# Patient Record
Sex: Male | Born: 1950 | Race: White | Hispanic: No | Marital: Married | Smoking: Never smoker
Health system: Southern US, Community
[De-identification: ages and names within clinical notes are randomized; demographics above are authoritative.]

## PROBLEM LIST (undated history)

## (undated) DIAGNOSIS — M1990 Unspecified osteoarthritis, unspecified site: Secondary | ICD-10-CM

## (undated) DIAGNOSIS — R011 Cardiac murmur, unspecified: Secondary | ICD-10-CM

## (undated) HISTORY — PX: NASAL SEPTUM SURGERY: SHX37

## (undated) HISTORY — PX: ANKLE SURGERY: SHX546

## (undated) HISTORY — PX: COLONOSCOPY: SHX174

---

## 2002-04-01 HISTORY — PX: APPENDECTOMY: SHX54

## 2014-10-11 ENCOUNTER — Other Ambulatory Visit: Payer: Self-pay | Admitting: Family Medicine

## 2014-10-11 DIAGNOSIS — M545 Low back pain: Secondary | ICD-10-CM

## 2014-10-15 ENCOUNTER — Ambulatory Visit
Admission: RE | Admit: 2014-10-15 | Discharge: 2014-10-15 | Disposition: A | Discharge status: Home / Self Care | Payer: BLUE CROSS/BLUE SHIELD | Source: Ambulatory Visit | Attending: Family Medicine | Admitting: Family Medicine

## 2014-10-15 DIAGNOSIS — M545 Low back pain: Secondary | ICD-10-CM

## 2014-10-23 ENCOUNTER — Ambulatory Visit
Admission: RE | Admit: 2014-10-23 | Discharge: 2014-10-23 | Disposition: A | Discharge status: Home / Self Care | Payer: BLUE CROSS/BLUE SHIELD | Source: Ambulatory Visit | Attending: Family Medicine | Admitting: Family Medicine

## 2015-08-30 NOTE — H&P (Signed)
  Gregory Blackburn is an 64 y.o. male.    Chief Complaint: right shoulder pain  HPI: Pt is a 65 y.o. male complaining of right shoulder pain for multiple years. Pain had continually increased since the beginning. X-rays in the clinic show end-stage arthritic changes of the right shoulder. Pt has tried various conservative treatments which have failed to alleviate their symptoms, including injections and therapy. Various options are discussed with the patient. Risks, benefits and expectations were discussed with the patient. Patient understand the risks, benefits and expectations and wishes to proceed with surgery.   PCP:  No primary care provider on file.  D/C Plans: Home  PMH: No past medical history on file.  PSH: No past surgical history on file.  Social History:  has no tobacco, alcohol, and drug history on file.  Allergies:  Allergies not on file  Medications: No current facility-administered medications for this encounter.   No current outpatient prescriptions on file.    No results found for this or any previous visit (from the past 48 hour(s)). No results found.  ROS: Pain with rom of the right upper extremity  Physical Exam:  Alert and oriented 65 y.o. male in no acute distress Cranial nerves 2-12 intact Cervical spine: full rom with no tenderness, nv intact distally Chest: active breath sounds bilaterally, no wheeze rhonchi or rales Heart: regular rate and rhythm, no murmur Abd: non tender non distended with active bowel sounds Hip is stable with rom  Right shoulder with moderate limitation with rom nv intact distally No rashes or edema Strength of ER and IR 4/5  Assessment/Plan Assessment: right shoulder osteoarthritis  Plan: Patient will undergo a right shoulder hemi arthroplasty by Dr. Veverly Fells at [facility]. Risks benefits and expectations were discussed with the patient. Patient understand risks, benefits and expectations and wishes to proceed.

## 2015-09-01 ENCOUNTER — Encounter (HOSPITAL_COMMUNITY)
Admission: RE | Admit: 2015-09-01 | Discharge: 2015-09-01 | Disposition: A | Discharge status: Home / Self Care | Payer: BLUE CROSS/BLUE SHIELD | Source: Ambulatory Visit | Attending: Orthopedic Surgery | Admitting: Orthopedic Surgery

## 2015-09-01 ENCOUNTER — Encounter (HOSPITAL_COMMUNITY): Payer: Self-pay

## 2015-09-01 DIAGNOSIS — Z01812 Encounter for preprocedural laboratory examination: Secondary | ICD-10-CM | POA: Insufficient documentation

## 2015-09-01 DIAGNOSIS — M19011 Primary osteoarthritis, right shoulder: Secondary | ICD-10-CM | POA: Insufficient documentation

## 2015-09-01 HISTORY — DX: Unspecified osteoarthritis, unspecified site: M19.90

## 2015-09-01 HISTORY — DX: Cardiac murmur, unspecified: R01.1

## 2015-09-01 LAB — SURGICAL PCR SCREEN
MRSA, PCR: NEGATIVE
STAPHYLOCOCCUS AUREUS: POSITIVE — AB

## 2015-09-01 NOTE — Progress Notes (Signed)
I called a prescription for Mupirocin ointment to [facility], [place], [place].

## 2015-09-01 NOTE — Progress Notes (Signed)
PCP - [place] Pepper Cardiologist - denies  Chest x-ray - denies EKG - denies Stress Test - 2011 cant remember where ECHO - denies Cardiac Cath -denies     Patient denies shortness of breath and chest pain at PAT appointment

## 2015-09-01 NOTE — Pre-Procedure Instructions (Signed)
    Cadien Ensey  09/01/2015      [id number] [address], [place] [address] Phone: [phone] Fax: [phone]    Your procedure is scheduled on Friday June 9.  Report to [facility] Admitting at [place].M.  Call this number if you have problems the morning of surgery:  [phone]   Remember:  Do not eat food or drink liquids after midnight.  Take these medicines the morning of surgery with A SIP OF WATER Zicam     Do not wear lotions, powders, or perfumes.  You may NOT wear deodorant.  Men may shave face and neck.  Do not bring valuables to the hospital.  [facility] is not responsible for any belongings or valuables.  Contacts, dentures or bridgework may not be worn into surgery.  Leave your suitcase in the car.  After surgery it may be brought to your room.  For patients admitted to the hospital, discharge time will be determined by your treatment team.  Patients discharged the day of surgery will not be allowed to drive home.  Special instructions:   [facility]- Preparing For Surgery  Before surgery, you can play an important role. Because skin is not sterile, your skin needs to be as free of germs as possible. You can reduce the number of germs on your skin by washing with CHG (chlorahexidine gluconate) Soap before surgery.  CHG is an antiseptic cleaner which kills germs and bonds with the skin to continue killing germs even after washing.  Please do not use if you have an allergy to CHG or antibacterial soaps. If your skin becomes reddened/irritated stop using the CHG.  Do not shave (including legs and underarms) for at least 48 hours prior to first CHG shower. It is OK to shave your face.  Please follow these instructions carefully.   1. Shower the NIGHT BEFORE SURGERY and the MORNING OF SURGERY with CHG.   2. If you chose to wash your hair, wash your hair first as usual with your normal  shampoo.  3. After you shampoo, rinse your hair and body thoroughly to remove the shampoo.  4. Use CHG as you would any other liquid soap. You can apply CHG directly to the skin and wash gently with a scrungie or a clean washcloth.   5. Apply the CHG Soap to your body ONLY FROM THE NECK DOWN.  Do not use on open wounds or open sores. Avoid contact with your eyes, ears, mouth and genitals (private parts). Wash genitals (private parts) with your normal soap.  6. Wash thoroughly, paying special attention to the area where your surgery will be performed.  7. Thoroughly rinse your body with warm water from the neck down.  8. DO NOT shower/wash with your normal soap after using and rinsing off the CHG Soap.  9. Pat yourself dry with a CLEAN TOWEL.   10. Wear CLEAN PAJAMAS   11. Place CLEAN SHEETS on your bed the night of your first shower and DO NOT SLEEP WITH PETS.    Day of Surgery: Do not apply any deodorants/lotions. Please wear clean clothes to the hospital/surgery center.      Please read over the following fact sheets that you were given. Pain Booklet, Coughing and Deep Breathing, Blood Transfusion Information, Total Joint Packet, MRSA Information and Surgical Site Infection Prevention

## 2015-09-07 ENCOUNTER — Other Ambulatory Visit (HOSPITAL_COMMUNITY): Payer: BLUE CROSS/BLUE SHIELD

## 2015-09-08 ENCOUNTER — Ambulatory Visit (HOSPITAL_COMMUNITY): Payer: BLUE CROSS/BLUE SHIELD | Admitting: Certified Registered"

## 2015-09-08 ENCOUNTER — Encounter (HOSPITAL_COMMUNITY): Payer: Self-pay | Admitting: *Deleted

## 2015-09-08 ENCOUNTER — Observation Stay (HOSPITAL_COMMUNITY): Payer: BLUE CROSS/BLUE SHIELD

## 2015-09-08 ENCOUNTER — Encounter (HOSPITAL_COMMUNITY)
Admission: AD | Disposition: A | Discharge status: Home / Self Care | Payer: Self-pay | Source: Ambulatory Visit | Attending: Orthopedic Surgery

## 2015-09-08 ENCOUNTER — Inpatient Hospital Stay (HOSPITAL_COMMUNITY)
Admission: AD | Admit: 2015-09-08 | Discharge: 2015-09-10 | DRG: 483 | Disposition: A | Discharge status: Home / Self Care | Payer: BLUE CROSS/BLUE SHIELD | Source: Ambulatory Visit | Attending: Orthopedic Surgery | Admitting: Orthopedic Surgery

## 2015-09-08 DIAGNOSIS — M25511 Pain in right shoulder: Secondary | ICD-10-CM | POA: Diagnosis present

## 2015-09-08 DIAGNOSIS — Z96611 Presence of right artificial shoulder joint: Secondary | ICD-10-CM

## 2015-09-08 DIAGNOSIS — Z96619 Presence of unspecified artificial shoulder joint: Secondary | ICD-10-CM

## 2015-09-08 DIAGNOSIS — M19011 Primary osteoarthritis, right shoulder: Principal | ICD-10-CM | POA: Diagnosis present

## 2015-09-08 HISTORY — PX: SHOULDER HEMI-ARTHROPLASTY: SHX5049

## 2015-09-08 LAB — BASIC METABOLIC PANEL
ANION GAP: 4 — AB (ref 5–15)
BUN: 18 mg/dL (ref 6–20)
CALCIUM: 9.2 mg/dL (ref 8.9–10.3)
CO2: 29 mmol/L (ref 22–32)
Chloride: 107 mmol/L (ref 101–111)
Creatinine, Ser: 0.84 mg/dL (ref 0.61–1.24)
GLUCOSE: 99 mg/dL (ref 65–99)
Potassium: 5 mmol/L (ref 3.5–5.1)
Sodium: 140 mmol/L (ref 135–145)

## 2015-09-08 LAB — CBC
HCT: 42.5 % (ref 39.0–52.0)
HEMOGLOBIN: 14.1 g/dL (ref 13.0–17.0)
MCH: 27.8 pg (ref 26.0–34.0)
MCHC: 33.2 g/dL (ref 30.0–36.0)
MCV: 83.8 fL (ref 78.0–100.0)
PLATELETS: 167 10*3/uL (ref 150–400)
RBC: 5.07 MIL/uL (ref 4.22–5.81)
RDW: 12.4 % (ref 11.5–15.5)
WBC: 5.5 10*3/uL (ref 4.0–10.5)

## 2015-09-08 SURGERY — HEMIARTHROPLASTY, SHOULDER
Anesthesia: Regional | Site: Shoulder | Laterality: Right

## 2015-09-08 MED ORDER — LACTATED RINGERS IV SOLN
INTRAVENOUS | Status: DC | PRN
  Administered 2015-09-08: 07:00:00 via INTRAVENOUS

## 2015-09-08 MED ORDER — METOCLOPRAMIDE HCL 5 MG/ML IJ SOLN: 5.0000 mg | Freq: Three times a day (TID) | INTRAMUSCULAR | Status: DC | PRN

## 2015-09-08 MED ORDER — MIDAZOLAM HCL 2 MG/2ML IJ SOLN
INTRAMUSCULAR | Status: AC
  Filled 2015-09-08: qty 2

## 2015-09-08 MED ORDER — PROPOFOL 10 MG/ML IV BOLUS
INTRAVENOUS | Status: DC | PRN
  Administered 2015-09-08: 150 mg via INTRAVENOUS

## 2015-09-08 MED ORDER — CEFAZOLIN SODIUM-DEXTROSE 2-4 GM/100ML-% IV SOLN: 2.0000 g | INTRAVENOUS | Status: DC

## 2015-09-08 MED ORDER — HYDROMORPHONE HCL 1 MG/ML IJ SOLN
0.5000 mg | INTRAMUSCULAR | Status: DC | PRN
  Administered 2015-09-09 (×4): 0.5 mg via INTRAVENOUS
  Filled 2015-09-08 (×4): qty 1

## 2015-09-08 MED ORDER — FENTANYL CITRATE (PF) 100 MCG/2ML IJ SOLN
INTRAMUSCULAR | Status: DC | PRN
  Administered 2015-09-08 (×2): 50 ug via INTRAVENOUS

## 2015-09-08 MED ORDER — ROCURONIUM BROMIDE 100 MG/10ML IV SOLN
INTRAVENOUS | Status: DC | PRN
  Administered 2015-09-08: 50 mg via INTRAVENOUS

## 2015-09-08 MED ORDER — METHOCARBAMOL 500 MG PO TABS
500.0000 mg | ORAL_TABLET | Freq: Four times a day (QID) | ORAL | Status: DC | PRN
  Administered 2015-09-09 – 2015-09-10 (×5): 500 mg via ORAL
  Filled 2015-09-08 (×6): qty 1

## 2015-09-08 MED ORDER — SODIUM CHLORIDE 0.9 % IV SOLN
INTRAVENOUS | Status: DC
  Administered 2015-09-08 (×2): via INTRAVENOUS

## 2015-09-08 MED ORDER — ONDANSETRON HCL 4 MG PO TABS: 4.0000 mg | ORAL_TABLET | Freq: Four times a day (QID) | ORAL | Status: DC | PRN

## 2015-09-08 MED ORDER — LIDOCAINE HCL (CARDIAC) 20 MG/ML IV SOLN
INTRAVENOUS | Status: DC | PRN
  Administered 2015-09-08: 60 mg via INTRAVENOUS

## 2015-09-08 MED ORDER — MIDAZOLAM HCL 5 MG/5ML IJ SOLN
INTRAMUSCULAR | Status: DC | PRN
  Administered 2015-09-08: 2 mg via INTRAVENOUS

## 2015-09-08 MED ORDER — DOCUSATE SODIUM 100 MG PO CAPS
100.0000 mg | ORAL_CAPSULE | Freq: Two times a day (BID) | ORAL | Status: DC
  Administered 2015-09-08 – 2015-09-10 (×6): 100 mg via ORAL
  Filled 2015-09-08 (×5): qty 1

## 2015-09-08 MED ORDER — CHLORHEXIDINE GLUCONATE 4 % EX LIQD: 60.0000 mL | Freq: Once | CUTANEOUS | Status: DC

## 2015-09-08 MED ORDER — LIDOCAINE 2% (20 MG/ML) 5 ML SYRINGE
INTRAMUSCULAR | Status: AC
  Filled 2015-09-08: qty 5

## 2015-09-08 MED ORDER — METHOCARBAMOL 1000 MG/10ML IJ SOLN
500.0000 mg | Freq: Four times a day (QID) | INTRAMUSCULAR | Status: DC | PRN
  Filled 2015-09-08: qty 5

## 2015-09-08 MED ORDER — PROPOFOL 10 MG/ML IV BOLUS
INTRAVENOUS | Status: AC
  Filled 2015-09-08: qty 20

## 2015-09-08 MED ORDER — SUGAMMADEX SODIUM 200 MG/2ML IV SOLN
INTRAVENOUS | Status: DC | PRN
  Administered 2015-09-08: 140 mg via INTRAVENOUS

## 2015-09-08 MED ORDER — OXYCODONE HCL 5 MG PO TABS
5.0000 mg | ORAL_TABLET | ORAL | Status: DC | PRN
  Administered 2015-09-08 – 2015-09-10 (×12): 10 mg via ORAL
  Filled 2015-09-08 (×12): qty 2

## 2015-09-08 MED ORDER — ROCURONIUM BROMIDE 50 MG/5ML IV SOLN
INTRAVENOUS | Status: AC
  Filled 2015-09-08: qty 1

## 2015-09-08 MED ORDER — METHOCARBAMOL 500 MG PO TABS: 500.0000 mg | ORAL_TABLET | Freq: Three times a day (TID) | ORAL | Status: DC | PRN

## 2015-09-08 MED ORDER — ONDANSETRON HCL 4 MG/2ML IJ SOLN
INTRAMUSCULAR | Status: DC | PRN
  Administered 2015-09-08: 4 mg via INTRAVENOUS

## 2015-09-08 MED ORDER — METOCLOPRAMIDE HCL 5 MG PO TABS: 5.0000 mg | ORAL_TABLET | Freq: Three times a day (TID) | ORAL | Status: DC | PRN

## 2015-09-08 MED ORDER — ONDANSETRON HCL 4 MG/2ML IJ SOLN: 4.0000 mg | Freq: Four times a day (QID) | INTRAMUSCULAR | Status: DC | PRN

## 2015-09-08 MED ORDER — PHENOL 1.4 % MT LIQD: 1.0000 | OROMUCOSAL | Status: DC | PRN

## 2015-09-08 MED ORDER — FENTANYL CITRATE (PF) 250 MCG/5ML IJ SOLN
INTRAMUSCULAR | Status: AC
  Filled 2015-09-08: qty 5

## 2015-09-08 MED ORDER — POLYETHYLENE GLYCOL 3350 17 G PO PACK: 17.0000 g | PACK | Freq: Every day | ORAL | Status: DC | PRN

## 2015-09-08 MED ORDER — BUPIVACAINE-EPINEPHRINE (PF) 0.5% -1:200000 IJ SOLN
INTRAMUSCULAR | Status: DC | PRN
  Administered 2015-09-08: 30 mL via PERINEURAL

## 2015-09-08 MED ORDER — PHENYLEPHRINE HCL 10 MG/ML IJ SOLN
10.0000 mg | INTRAVENOUS | Status: DC | PRN
  Administered 2015-09-08: 50 ug/min via INTRAVENOUS

## 2015-09-08 MED ORDER — BUPIVACAINE-EPINEPHRINE (PF) 0.25% -1:200000 IJ SOLN
INTRAMUSCULAR | Status: DC | PRN
  Administered 2015-09-08: 7 mL

## 2015-09-08 MED ORDER — METOCLOPRAMIDE HCL 5 MG/ML IJ SOLN
10.0000 mg | Freq: Once | INTRAMUSCULAR | Status: DC | PRN
Start: 2015-09-08 — End: 2015-09-08

## 2015-09-08 MED ORDER — FENTANYL CITRATE (PF) 100 MCG/2ML IJ SOLN: 25.0000 ug | INTRAMUSCULAR | Status: DC | PRN

## 2015-09-08 MED ORDER — OXYCODONE-ACETAMINOPHEN 5-325 MG PO TABS: 1.0000 | ORAL_TABLET | ORAL | Status: DC | PRN

## 2015-09-08 MED ORDER — SODIUM CHLORIDE 0.9 % IR SOLN
Status: DC | PRN
  Administered 2015-09-08: 1000 mL

## 2015-09-08 MED ORDER — CEFAZOLIN SODIUM-DEXTROSE 2-4 GM/100ML-% IV SOLN
2.0000 g | Freq: Four times a day (QID) | INTRAVENOUS | Status: AC
  Administered 2015-09-08 – 2015-09-09 (×3): 2 g via INTRAVENOUS
  Filled 2015-09-08 (×4): qty 100

## 2015-09-08 MED ORDER — MENTHOL 3 MG MT LOZG: 1.0000 | LOZENGE | OROMUCOSAL | Status: DC | PRN

## 2015-09-08 MED ORDER — MEPERIDINE HCL 25 MG/ML IJ SOLN: 6.2500 mg | INTRAMUSCULAR | Status: DC | PRN

## 2015-09-08 MED ORDER — ACETAMINOPHEN 650 MG RE SUPP: 650.0000 mg | Freq: Four times a day (QID) | RECTAL | Status: DC | PRN

## 2015-09-08 MED ORDER — ACETAMINOPHEN 325 MG PO TABS
650.0000 mg | ORAL_TABLET | Freq: Four times a day (QID) | ORAL | Status: DC | PRN
  Administered 2015-09-09 – 2015-09-10 (×3): 650 mg via ORAL
  Filled 2015-09-08 (×3): qty 2

## 2015-09-08 MED ORDER — CEFAZOLIN SODIUM-DEXTROSE 2-4 GM/100ML-% IV SOLN
INTRAVENOUS | Status: AC
  Administered 2015-09-08: 2 g via INTRAVENOUS
  Filled 2015-09-08: qty 100

## 2015-09-08 SURGICAL SUPPLY — 59 items
BLADE SAW SAG 73X25 THK (BLADE) ×2
BLADE SAW SGTL 73X25 THK (BLADE) ×1 IMPLANT
BLADE SURG 10 STRL SS (BLADE) ×3 IMPLANT
BOWL SMART MIX CTS (DISPOSABLE) IMPLANT
CAPT SHLDR PARTIAL 1 ×3 IMPLANT
CLOSURE WOUND 1/2 X4 (GAUZE/BANDAGES/DRESSINGS) ×1
COVER SURGICAL LIGHT HANDLE (MISCELLANEOUS) ×3 IMPLANT
DRAPE IMP U-DRAPE 54X76 (DRAPES) ×3 IMPLANT
DRAPE INCISE IOBAN 66X45 STRL (DRAPES) ×3 IMPLANT
DRAPE ORTHO SPLIT 87X125 STRL (DRAPES) ×6 IMPLANT
DRAPE U-SHAPE 47X51 STRL (DRAPES) ×3 IMPLANT
DRILL BIT 5/64 (BIT) ×3 IMPLANT
DRSG ADAPTIC 3X8 NADH LF (GAUZE/BANDAGES/DRESSINGS) ×3 IMPLANT
DRSG PAD ABDOMINAL 8X10 ST (GAUZE/BANDAGES/DRESSINGS) ×3 IMPLANT
DURAPREP 26ML APPLICATOR (WOUND CARE) ×3 IMPLANT
ELECT NEEDLE TIP 2.8 STRL (NEEDLE) ×3 IMPLANT
ELECT REM PT RETURN 9FT ADLT (ELECTROSURGICAL) ×3
ELECTRODE REM PT RTRN 9FT ADLT (ELECTROSURGICAL) ×1 IMPLANT
GAUZE SPONGE 4X4 12PLY STRL (GAUZE/BANDAGES/DRESSINGS) ×3 IMPLANT
GLOVE BIOGEL PI ORTHO PRO 7.5 (GLOVE) ×2
GLOVE BIOGEL PI ORTHO PRO SZ8 (GLOVE) ×2
GLOVE ORTHO TXT STRL SZ7.5 (GLOVE) ×3 IMPLANT
GLOVE PI ORTHO PRO STRL 7.5 (GLOVE) ×1 IMPLANT
GLOVE PI ORTHO PRO STRL SZ8 (GLOVE) ×1 IMPLANT
GLOVE SURG ORTHO 8.5 STRL (GLOVE) ×3 IMPLANT
GOWN STRL REUS W/ TWL XL LVL3 (GOWN DISPOSABLE) ×2 IMPLANT
GOWN STRL REUS W/TWL XL LVL3 (GOWN DISPOSABLE) ×4
KIT BASIN OR (CUSTOM PROCEDURE TRAY) ×3 IMPLANT
KIT ROOM TURNOVER OR (KITS) ×3 IMPLANT
MANIFOLD NEPTUNE II (INSTRUMENTS) ×3 IMPLANT
NDL SUT .5 MAYO 1.404X.05X (NEEDLE) ×1 IMPLANT
NEEDLE HYPO 25GX1X1/2 BEV (NEEDLE) ×3 IMPLANT
NEEDLE MAYO TAPER (NEEDLE) ×2
NS IRRIG 1000ML POUR BTL (IV SOLUTION) ×3 IMPLANT
PACK SHOULDER (CUSTOM PROCEDURE TRAY) ×3 IMPLANT
PACK UNIVERSAL I (CUSTOM PROCEDURE TRAY) ×3 IMPLANT
PAD ARMBOARD 7.5X6 YLW CONV (MISCELLANEOUS) ×6 IMPLANT
SLEEVE SURGEON STRL (DRAPES) ×3 IMPLANT
SLING ARM FOAM STRAP LRG (SOFTGOODS) ×3 IMPLANT
SLING ARM IMMOBILIZER LRG (SOFTGOODS) ×3 IMPLANT
SLING ARM IMMOBILIZER MED (SOFTGOODS) IMPLANT
SPONGE LAP 18X18 X RAY DECT (DISPOSABLE) ×3 IMPLANT
STRIP CLOSURE SKIN 1/2X4 (GAUZE/BANDAGES/DRESSINGS) ×2 IMPLANT
SUCTION FRAZIER HANDLE 10FR (MISCELLANEOUS) ×2
SUCTION TUBE FRAZIER 10FR DISP (MISCELLANEOUS) ×1 IMPLANT
SUT FIBERWIRE #2 38 T-5 BLUE (SUTURE) ×15
SUT MNCRL AB 4-0 PS2 18 (SUTURE) ×3 IMPLANT
SUT VIC AB 0 CT1 27 (SUTURE) ×2
SUT VIC AB 0 CT1 27XBRD ANBCTR (SUTURE) ×1 IMPLANT
SUT VIC AB 0 CT2 27 (SUTURE) ×3 IMPLANT
SUT VIC AB 2-0 CT1 27 (SUTURE) ×2
SUT VIC AB 2-0 CT1 TAPERPNT 27 (SUTURE) ×1 IMPLANT
SUTURE FIBERWR #2 38 T-5 BLUE (SUTURE) ×5 IMPLANT
SYR BULB IRRIGATION 50ML (SYRINGE) IMPLANT
SYR CONTROL 10ML LL (SYRINGE) ×3 IMPLANT
TOWEL OR 17X24 6PK STRL BLUE (TOWEL DISPOSABLE) ×3 IMPLANT
TOWEL OR 17X26 10 PK STRL BLUE (TOWEL DISPOSABLE) ×3 IMPLANT
TRAY FOLEY CATH 16FRSI W/METER (SET/KITS/TRAYS/PACK) IMPLANT
WATER STERILE IRR 1000ML POUR (IV SOLUTION) ×3 IMPLANT

## 2015-09-08 NOTE — Anesthesia Postprocedure Evaluation (Signed)
Anesthesia Post Note  Patient: Gregory Blackburn  Procedure(s) Performed: Procedure(s) (LRB): RIGHT SHOULDER HEMI-ARTHROPLASTY  (Right)  Patient location during evaluation: PACU Anesthesia Type: General and Regional Level of consciousness: awake and alert and oriented Pain management: pain level controlled Vital Signs Assessment: post-procedure vital signs reviewed and stable Respiratory status: spontaneous breathing, nonlabored ventilation and respiratory function stable Cardiovascular status: blood pressure returned to baseline and stable Postop Assessment: no signs of nausea or vomiting Anesthetic complications: no Comments: No complaints of pain.    Last Vitals:  Filed Vitals:   09/08/15 0942 09/08/15 0945  BP: 128/74   Pulse: 65 61  Temp:    Resp: 15 13    Last Pain:  Filed Vitals:   09/08/15 0948  PainSc: 0-No pain                 Beadie Matsunaga A.

## 2015-09-08 NOTE — Brief Op Note (Signed)
09/08/2015  9:23 AM  PATIENT:  Gregory Blackburn  65 y.o. male  PRE-OPERATIVE DIAGNOSIS:  RIGHT SHOULDER ROTATOR CUFF ARTHOPATHY   POST-OPERATIVE DIAGNOSIS:  RIGHT SHOULDER ROTATOR CUFF ARTHOPATHY   PROCEDURE:  Procedure(s): RIGHT SHOULDER HEMI-ARTHROPLASTY  (Right) [employer] unite with CTA Head  SURGEON:  Surgeon(s) and Role:    * Netta Cedars, MD - Primary  PHYSICIAN ASSISTANT:   ASSISTANTS: Gerrit Halls, PA-C   ANESTHESIA:   regional and general  EBL:     BLOOD ADMINISTERED:none  DRAINS: none   LOCAL MEDICATIONS USED:  MARCAINE     SPECIMEN:  No Specimen  DISPOSITION OF SPECIMEN:  N/A  COUNTS:  YES  TOURNIQUET:  * No tourniquets in log *  DICTATION: .Other Dictation: Dictation Number [id number]  PLAN OF CARE: Admit to inpatient   PATIENT DISPOSITION:  PACU - hemodynamically stable.   Delay start of Pharmacological VTE agent (>24hrs) due to surgical blood loss or risk of bleeding: yes

## 2015-09-08 NOTE — Anesthesia Procedure Notes (Addendum)
Date/Time: 09/08/2015 7:05 AM Performed by: Josephine Igo Oxygen Delivery Method: Nasal cannula    Anesthesia Regional Block:  Interscalene brachial plexus block  Pre-Anesthetic Checklist: ,, timeout performed, Correct Patient, Correct Site, Correct Laterality, Correct Procedure, Correct Position, site marked, Risks and benefits discussed,  Surgical consent,  Pre-op evaluation,  At surgeon's request and post-op pain management  Laterality: Right  Prep: chloraprep       Needles:  Injection technique: Single-shot  Needle Type: Echogenic Stimulator Needle     Needle Length: 5cm 5 cm Needle Gauge: 21 and 21 G  Needle insertion depth: 3 cm   Additional Needles:  Procedures: ultrasound guided (picture in chart) Interscalene brachial plexus block Narrative:  Start time: 09/08/2015 7:15 AM End time: 09/08/2015 7:27 AM  Performed by: Personally  Anesthesiologist: Josephine Igo  Additional Notes: Relevant anatomy ID'd by [place]. Incremental 29ml injection with frequent aspiration. Patient tolerated procedrue well.   Procedure Name: Intubation Date/Time: 09/08/2015 7:40 AM Performed by: Oletta Lamas Pre-anesthesia Checklist: Patient identified, Emergency Drugs available, Suction available and Patient being monitored Patient Re-evaluated:Patient Re-evaluated prior to inductionOxygen Delivery Method: Circle System Utilized Preoxygenation: Pre-oxygenation with 100% oxygen Intubation Type: IV induction Ventilation: Mask ventilation without difficulty Laryngoscope Size: Mac and 3 Grade View: Grade I Tube type: Oral Tube size: 8.0 mm Number of attempts: 2 Airway Equipment and Method: Stylet Placement Confirmation: ETT inserted through vocal cords under direct vision,  positive ETCO2 and breath sounds checked- equal and bilateral Secured at: 23 cm Tube secured with: Tape Dental Injury: Teeth and Oropharynx as per pre-operative assessment       Right Interscalene Block

## 2015-09-08 NOTE — Transfer of Care (Signed)
Immediate Anesthesia Transfer of Care Note  Patient: Gregory Blackburn  Procedure(s) Performed: Procedure(s): RIGHT SHOULDER HEMI-ARTHROPLASTY  (Right)  Patient Location: PACU  Anesthesia Type:GA combined with regional for post-op pain  Level of Consciousness: awake, alert  and patient cooperative  Airway & Oxygen Therapy: Patient Spontanous Breathing  Post-op Assessment: Report given to RN  Post vital signs: stable  Last Vitals:  Filed Vitals:   09/08/15 0600 09/08/15 0925  BP: 119/76   Pulse: 54   Temp: 36.6 C 36.4 C  Resp: 20     Last Pain: There were no vitals filed for this visit.    Patients Stated Pain Goal: 5 ([id number])  Complications: No apparent anesthesia complications   To PACU 2L [place].  No pain or discomforts voiced.

## 2015-09-08 NOTE — Anesthesia Preprocedure Evaluation (Addendum)
Anesthesia Evaluation  Patient identified by MRN, date of birth, ID band Patient awake    Reviewed: Allergy & Precautions, NPO status , Patient's Chart, lab work & pertinent test results  Airway Mallampati: II  TM Distance: >3 FB Neck ROM: Full    Dental no notable dental hx. (+) Teeth Intact, Dental Advisory Given   Pulmonary neg pulmonary ROS,    Pulmonary exam normal breath sounds clear to auscultation       Cardiovascular negative cardio ROS Normal cardiovascular exam+ Valvular Problems/Murmurs  Rhythm:Regular Rate:Normal     Neuro/Psych negative neurological ROS  negative psych ROS   GI/Hepatic negative GI ROS, Neg liver ROS,   Endo/Other  negative endocrine ROS  Renal/GU negative Renal ROS  negative genitourinary   Musculoskeletal  (+) Arthritis , Osteoarthritis,  Right shoulder rotator cuff arthropathy   Abdominal   Peds  Hematology negative hematology ROS (+)   Anesthesia Other Findings   Reproductive/Obstetrics                            Anesthesia Physical Anesthesia Plan  ASA: I  Anesthesia Plan: General and Regional   Post-op Pain Management: GA combined w/ Regional for post-op pain   Induction: Intravenous  Airway Management Planned: Oral ETT  Additional Equipment:   Intra-op Plan:   Post-operative Plan: Extubation in OR  Informed Consent: I have reviewed the patients History and Physical, chart, labs and discussed the procedure including the risks, benefits and alternatives for the proposed anesthesia with the patient or authorized representative who has indicated his/her understanding and acceptance.   Dental advisory given  Plan Discussed with: CRNA, Anesthesiologist and Surgeon  Anesthesia Plan Comments:        Anesthesia Quick Evaluation

## 2015-09-08 NOTE — Interval H&P Note (Signed)
History and Physical Interval Note:  09/08/2015 7:28 AM  Gregory Blackburn  has presented today for surgery, with the diagnosis of RIGHT SHOULDER ROTATOR CUFF ARTHOPATHY   The various methods of treatment have been discussed with the patient and family. After consideration of risks, benefits and other options for treatment, the patient has consented to  Procedure(s): RIGHT SHOULDER HEMI-ARTHROPLASTY  (Right) as a surgical intervention .  The patient's history has been reviewed, patient examined, no change in status, stable for surgery.  I have reviewed the patient's chart and labs.  Questions were answered to the patient's satisfaction.     Bhargav Barbaro,STEVEN R

## 2015-09-09 NOTE — Progress Notes (Signed)
Subjective: 1 Day Post-Op Procedure(s) (LRB): RIGHT SHOULDER HEMI-ARTHROPLASTY  (Right) Patient reports pain as moderate requiring IV medicine. Tolerating PO's well. Reports a good night.  Wife at bedside. Denies SOB,CP,or F/c.  Objective: Vital signs in last 24 hours: Temp:  [97 F (36.1 C)-98.6 F (37 C)] 98.6 F (37 C) (06/10 0441) Pulse Rate:  [47-73] 73 (06/10 0441) Resp:  [12-16] 16 (06/10 0441) BP: (93-139)/(55-81) 130/71 mmHg (06/10 0441) SpO2:  [95 %-100 %] 95 % (06/10 0441)  Intake/Output from previous day: 06/09 0701 - 06/10 0700 In: 2249.2 [P.O.:840; I.V.:1309.2; IV Piggyback:100] Out: 75 [Blood:75] Intake/Output this shift: Total I/O In: 1107.5 [P.O.:240; I.V.:867.5] Out: -    Recent Labs  09/08/15 0643  HGB 14.1    Recent Labs  09/08/15 0643  WBC 5.5  RBC 5.07  HCT 42.5  PLT 167    Recent Labs  09/08/15 0643  NA 140  K 5.0  CL 107  CO2 29  BUN 18  CREATININE 0.84  GLUCOSE 99  CALCIUM 9.2   No results for input(s): LABPT, INR in the last 72 hours.  Well nourished. Alert and oriented x3. RRR, Lungs clear, BS x4. Abdomen soft and non tender. Right Calf soft and non tender. Right shoulder dressing C/D/I. No DVT signs. Compartment soft. No signs of infection.  Right LE grossly neurovascular intact.  Assessment/Plan: 1 Day Post-Op Procedure(s) (LRB): RIGHT SHOULDER HEMI-ARTHROPLASTY  (Right) Sling for comfort Transition to PO meds Plan for D/c tomorrow OT today Continue current care   Shahram Alexopoulos L 09/09/2015, 9:06 AM

## 2015-09-09 NOTE — Op Note (Signed)
Gregory Blackburn, Gregory Blackburn NO.:  [id number]  MEDICAL RECORD NO.:  [id number]  LOCATION:  [id number]                        FACILITY:  [facility]  PHYSICIAN:  Doran Heater. Veverly Fells, M.D. DATE OF BIRTH:  1950-05-24  DATE OF PROCEDURE:  09/08/2015 DATE OF DISCHARGE:                              OPERATIVE REPORT   PREOPERATIVE DIAGNOSIS:  Right shoulder rotator cuff tear arthropathy.  POSTOPERATIVE DIAGNOSIS:  Right shoulder rotator cuff tear arthropathy.  PROCEDURE PERFORMED:  Right shoulder hemiarthroplasty using [place] with CTA head.  SURGEON:  Doran Heater. Veverly Fells, M.D.  ASSISTANT:  Judith Part. Chabon, P.A.  ANESTHESIA:  General anesthesia was used plus interscalene block.  ESTIMATED BLOOD LOSS:  Less than 100 mL.  FLUID REPLACEMENT:  1200 mL crystalloid.  INSTRUMENT COUNTS:  Correct.  COMPLICATIONS:  There were no complications.  ANTIBIOTICS:  Perioperative antibiotics given.  INDICATIONS:  The patient is a 65 year old male with worsening right shoulder pain secondary to rotator cuff tear arthropathy.  The patient has had progressive pain despite conservative management.  He has MRI evidence of complete loss of supraspinatus, infraspinatus, and teres minor muscles and tendons.  The patient has intact subscapularis.  We discussed options of management including reverse shoulder arthroplasty versus a conventional hemiarthroplasty with a CTA head to accommodate for the rotator cuff absence.  Based on his absence of teres minor and the concern for significant Hornblower's issues when he abducts his arm with a reverse shoulder and also the potential longevity for him at age 70, we did recommend a hemiarthroplasty versus a reverse.  The patient understands he will have good but not great pain relief and good function.  This will be a more durable option and allow him to do more physical things.  Risks and benefits of surgery discussed, informed consent  obtained.  DESCRIPTION OF PROCEDURE:  After an adequate level of anesthesia was achieved, the patient was positioned in the modified beach chair position.  Right shoulder was correctly identified and sterilely prepped and draped in the usual manner.  Time-out was called.  We entered the shoulder in standard deltopectoral approach.  We started at the coracoid process extending down to the anterior humerus.  We identified the cephalic vein, took it laterally with the deltoid, pectoralis taken medially.  Conjoint tendon identified and retracted medially.  We identified the subscapularis and released it subperiosteally off the lesser tuberosity and tagged for repair.  We then progressively released the inferior capsule, externally rotating.  We then went ahead and deliver the humeral head out of the wound.  We then made our head cut, using the neck resection guide and the oscillating saw.  We cut this in 20 degrees of retroversion to accommodate for the [employer] stem and the CTA head hemiarthroplasty.  We then reamed up to a size 10, this patient had a significantly tapered canal, but a pretty copious proximal metaphysis which created the tendency for the stem towards falling into varus.  We went ahead and reamed and then we broached for first the 8 and then the 10 stem.  We then went ahead and resected the bone for the CTA head hemiarthroplasty.  We  also tenodesed the biceps mid tension prior to doing the tenotomy.  We did figure-of-eight sutures through the pectoralis tendon insertion further reinforcing our tenodesis.  We then resected the remaining biceps tendon and inspected the socket, there was nice cartilage in there and the labrum was intact.  We freed up the subscapularis both on the joint side and on the extra-articular side. We had nice balance with that and felt like the subscap was adequate. We then went ahead and after cutting the lateral greater tuberosity off for the  CTA, we trialed with a 48 x 23 trial and felt like that gave excellent coverage and also good stability.  We removed the trial components, irrigated the canal, placed #2 FiberWire suture in the lesser tuberosity through drill holes for repair.  We then used impaction grafting technique with a lot of bone graft to the medial side of the stem to keep the stem out of varus and we used some hard bone from the subchondral bone from the head and impacted that in medially as well to prevent any drift there and we had very good impaction grafting and we were able to maintain a nice vertical stem orientation, the 48 x 23 CTA head fit perfectly, we impacted that in place, reduced the shoulder, irrigated thoroughly and repaired the subscap anatomically back to the lesser tuberosity to bleeding bone and then also reinforcing into that biceps tendon there.  We were pleased with the excursion and stability following the subscap repair.  We irrigated thoroughly and then closed the deltopectoral interval with 0 Vicryl suture followed by 2-0 Vicryl for subcutaneous closure and 4-0 Monocryl for skin.  Steri- Strips applied followed by sterile dressing.  The patient tolerated the surgery well.     Doran Heater. Veverly Fells, M.D.     SRN/MEDQ  D:  09/08/2015  T:  09/09/2015  Job:  [id number]

## 2015-09-09 NOTE — Progress Notes (Signed)
Orthopedic Tech Progress Note Patient Details:  Gregory Blackburn 1950-10-22 [id number]  Ortho Devices Type of Ortho Device: Arm sling Ortho Device/Splint Location: rue Ortho Device/Splint Interventions: Application   Hildred Priest 09/09/2015, 2:51 PM

## 2015-09-09 NOTE — Evaluation (Signed)
Occupational Therapy Evaluation Patient Details Name: Gregory Blackburn MRN: [id number] DOB: 1951-02-19 Today's Date: 09/09/2015    History of Present Illness 65 y.o. 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 Pt now s/p Rt shoulder hemiarthroplasty.   Clinical Impression   Pt s/p above. Pt getting assist with UB dressing at times, PTA. Feel pt will benefit from acute OT to increase independence and address RUE prior to d/c.     Follow Up Recommendations  No OT follow up;Supervision - Intermittent    Equipment Recommendations  Other (comment) (TBD-may benefit from shower chair)    Recommendations for Other Services       Precautions / Restrictions Precautions Precautions: Shoulder Type of Shoulder Precautions: conservative protocol: no movement of Rt shoulder, AROM elbow, wrist hand okay; no pushing, pulling, lifting with RUE (can use to hold light items). Shoulder Interventions: Shoulder sling/immobilizer;At all times;Off for dressing/bathing/exercises Precaution Booklet Issued: Yes (comment) Precaution Comments: educated on shoulder precautions Required Braces or Orthoses: Sling Restrictions Weight Bearing Restrictions: Yes RUE Weight Bearing: Non weight bearing      Mobility Bed Mobility Overal bed mobility: Needs Assistance Bed Mobility: Supine to Sit;Sit to Supine     Supine to sit: Supervision Sit to supine:  (went to supine at Mod I but cue to scoot HOB)      Transfers Overall transfer level: Modified independent                    Balance    No LOB in session.                                        ADL Overall ADL's : Needs assistance/impaired                     Lower Body Dressing: Minimal assistance;Sit to/from stand   Toilet Transfer: Supervision/safety;Ambulation (sit to stand from bed)           Functional mobility during ADLs: Supervision/safety       Vision     Perception     Praxis      Pertinent  Vitals/Pain Pain Assessment: 0-10 Pain Score:  (7.5) Pain Location: RUE Pain Descriptors / Indicators: Aching Pain Intervention(s): Monitored during session;Repositioned     Hand Dominance Right   Extremity/Trunk Assessment Upper Extremity Assessment Upper Extremity Assessment: RUE deficits/detail RUE Deficits / Details: elbow, wrist, hand ROM WFL RUE Sensation: decreased light touch RUE Coordination: decreased fine motor (reports it's slower than normal )   Lower Extremity Assessment Lower Extremity Assessment: Overall WFL for tasks assessed       Communication Communication Communication: No difficulties   Cognition Arousal/Alertness: Lethargic Behavior During Therapy: WFL for tasks assessed/performed Overall Cognitive Status: Within Functional Limits for tasks assessed                     General Comments       Exercises Exercises: Other exercises;Shoulder Other Exercises Other Exercises: performed Rt elbow flexion/extension, moved right digits (flexion/extension), and Rt wrist flexion/extension. pt also moved neck around   Shoulder Instructions Shoulder Instructions Donning/doffing shirt without moving shoulder:  (educated on technique and UB clothing ) Method for sponge bathing under operated UE:  (educated) Donning/doffing sling/immobilizer: Maximal assistance (educated and assisted) Correct positioning of sling/immobilizer: Maximal assistance (educated and assisted) ROM for elbow, wrist and digits of operated UE: Supervision/safety  Sling wearing schedule (on at all times/off for ADL's):  (educated) Proper positioning of operated UE when showering:  (educated) Positioning of UE while sleeping:  (educated)    Home Living Family/patient expects to be discharged to:: Private residence Living Arrangements: Spouse/significant other Available Help at Discharge: Family;Available 24 hours/day Type of Home: Apartment Home Access: Stairs to enter Entrance  Stairs-Number of Steps: 16 Entrance Stairs-Rails: Right;Left;Can reach both Home Layout: One level     Bathroom Shower/Tub: Occupational psychologist: Standard                Prior Functioning/Environment Level of Independence: Needs assistance    ADL's / Homemaking Assistance Needed: assist with UB dressing at times        OT Diagnosis: Acute pain   OT Problem List: Decreased knowledge of use of DME or AE;Decreased knowledge of precautions;Decreased coordination;Impaired UE functional use;Pain;Impaired sensation;Decreased activity tolerance;Decreased range of motion   OT Treatment/Interventions: Self-care/ADL training;DME and/or AE instruction;Therapeutic activities;Balance training;Patient/family education;Therapeutic exercise    OT Goals(Current goals can be found in the care plan section) Acute Rehab OT Goals Patient Stated Goal: not stated OT Goal Formulation: With patient Time For Goal Achievement: 09/16/15 Potential to Achieve Goals: Good ADL Goals Additional ADL Goal #1: pt/caregiver will be at Mod I level for managing ADLS while maintaining shoulder precautions. Additional ADL Goal #2: Pt will be independent with RUE HEP while maintaining shoulder precautions.  OT Frequency: Min 2X/week   Barriers to D/C:            Co-evaluation              End of Session Equipment Utilized During Treatment: Other (comment) (sling)  Activity Tolerance: Patient tolerated treatment well Patient left: in bed;with call bell/phone within reach   Time: [id number] OT Time Calculation (min): 21 min Charges:  OT General Charges $OT Visit: 1 Procedure OT Evaluation $OT Eval Moderate Complexity: 1 Procedure G-CodesBenito Mccreedy OTR/L [id number] 09/09/2015, 10:12 AM

## 2015-09-10 NOTE — Discharge Instructions (Signed)
Ice to the shoulder as much as possible.  Do not push pull or lift with the right arm  Use the sling while up to support the arm, may remove and rest the arm on a pillow while seated.  Use sling at night.  Keep the incision clean and dry and covered for one week, then ok to get it wet in the shower.  Do exercises every hour while awake - pendulums, lap slides and wrist and elbow ROM  Follow up in the office in two weeks  [phone] Shoulder Joint Replacement, Care After Refer to this sheet in the next few weeks. These instructions provide you with information on caring for yourself after your procedure. Your health care provider may also give you more specific instructions. Your treatment has been planned according to current medical practices, but problems sometimes occur. Call your health care provider if you have any problems or questions after your procedure. WHAT TO EXPECT AFTER THE PROCEDURE After your procedure, your arm and shoulder will typically be stiff and bruised. This will improve over time. HOME CARE INSTRUCTIONS   You may resume your normal diet and activities as directed by your surgeon.  You should regain full use of your shoulder in 6 weeks.  Your arm will be in a sling. You will need to wear this for 4-6 weeks after surgery.  Wear the sling every night for at least the first month, or as instructed by your surgeon.  Do not use your arm to push yourself up in bed or from a chair. This requires too much muscle.  Follow the program of home exercises suggested. Do the exercises 4-5 times a day for a month or as directed.  Try not to overuse your shoulder. Overusing the shoulder is easy to do if this is the first time you have been pain free in a long time. Early overuse of the shoulder may result in later problems.  Do not lift anything heavier than a cup of coffee for the first 6 weeks after surgery.  Ask for help. Your health care provider may be able to suggest a  clinic or agency for this if you do not have home support.  Do not participate in contact sports or do any heavy lifting (more than 10 lb [4.5 kg]) for at least 6 months, or as directed.  Apply ice to the injured area for the first 2 days after surgery:  Put ice in a plastic bag.  Place a towel between your skin and the bag.  Leave the ice on for 20 minutes, 2-3 times a day.  Change dressings if necessary or as directed.  Only take over-the-counter or prescription medicines for pain, discomfort, or fever as directed by your health care provider.  Keep all follow-up appointments as directed. SEEK MEDICAL CARE IF:  You have redness, swelling, or increasing pain in the wound.  You see pus coming from the wound.  You have a fever.  You notice a bad smell coming from the wound or dressing.  The edges of the wound break open after sutures or staples have been removed.  You have increasing pain with movement of the shoulder. SEEK IMMEDIATE MEDICAL CARE IF:   You develop a rash.  You have chest pain or shortness of breath.  You have any reaction or side effects to medicine given. MAKE SURE YOU:  Understand these instructions.  Will watch your condition.  Will get help right away if you are not doing  well or get worse.   This information is not intended to replace advice given to you by your health care provider. Make sure you discuss any questions you have with your health care provider.   Document Released: 10/05/2004 Document Revised: 03/23/2013 Document Reviewed: 10/15/2012 Elsevier Interactive Patient Education [employer].

## 2015-09-10 NOTE — Progress Notes (Signed)
Subjective: 2 Days Post-Op Procedure(s) (LRB): RIGHT SHOULDER HEMI-ARTHROPLASTY  (Right) Patient reports pain as 2 on 0-10 scale.  No post-Op problems.  Objective: Vital signs in last 24 hours: Temp:  [98.4 F (36.9 C)] 98.4 F (36.9 C) (06/11 0528) Pulse Rate:  [69-78] 69 (06/11 0528) Resp:  [16-18] 18 (06/11 0528) BP: (128)/(68-71) 128/71 mmHg (06/11 0528) SpO2:  [96 %-99 %] 98 % (06/11 0528)  Intake/Output from previous day: 06/10 0701 - 06/11 0700 In: 1707.5 [P.O.:840; I.V.:867.5] Out: -  Intake/Output this shift:     Recent Labs  09/08/15 0643  HGB 14.1    Recent Labs  09/08/15 0643  WBC 5.5  RBC 5.07  HCT 42.5  PLT 167    Recent Labs  09/08/15 0643  NA 140  K 5.0  CL 107  CO2 29  BUN 18  CREATININE 0.84  GLUCOSE 99  CALCIUM 9.2   No results for input(s): LABPT, INR in the last 72 hours.  Neurologically intact Neurovascular intact  Assessment/Plan: 2 Days Post-Op Procedure(s) (LRB): RIGHT SHOULDER HEMI-ARTHROPLASTY  (Right) Up with therapy Discharge home with home health  Jontue Crumpacker A 09/10/2015, 7:49 AM

## 2015-09-10 NOTE — Progress Notes (Signed)
Occupational Therapy Treatment Patient Details Name: Filomeno Frilot MRN: [id number] DOB: 14-Nov-1950 Today's Date: 09/10/2015    History of present illness 65 y.o. 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 Pt now s/p Rt shoulder hemiarthroplasty.   OT comments  Pt. And spouse educated and able to return demo of don/doff sling.  Pt. Able to perform all indicated RUE ROM.  Very motivated and eager to progress UE exercises when able.  Wife very engaged and taking notes throughout session.  D/c set for later today.    Follow Up Recommendations  No OT follow up;Supervision - Intermittent    Equipment Recommendations  Other (comment)    Recommendations for Other Services      Precautions / Restrictions Precautions Precautions: Shoulder Type of Shoulder Precautions: conservative protocol: no movement of Rt shoulder, AROM elbow, wrist hand okay; no pushing, pulling, lifting with RUE (can use to hold light items). Shoulder Interventions: Shoulder sling/immobilizer;At all times;Off for dressing/bathing/exercises Precaution Comments: educated on shoulder precautions Required Braces or Orthoses: Sling Restrictions RUE Weight Bearing: Non weight bearing       Mobility Bed Mobility                  Transfers                      Balance                                   ADL Overall ADL's : Needs assistance/impaired                 Upper Body Dressing : Maximal assistance;Standing;Sitting Upper Body Dressing Details (indicate cue type and reason): wife educated on donning doffing sling along with tech. for placing sx arm in sleeve first and removing first when donning/doffing shirts                          Vision                     Perception     Praxis      Cognition   Behavior During Therapy: [facility] for tasks assessed/performed Overall Cognitive Status: Within Functional Limits for tasks assessed                        Extremity/Trunk Assessment               Exercises Shoulder Exercises Elbow Flexion: Standing;AROM;Right;10 reps Elbow Extension: AROM;Standing;Right;10 reps Wrist Flexion: AROM;Standing;Right;10 reps Wrist Extension: AROM;Standing;Right;10 reps Digit Composite Flexion: AROM;Standing;Right;10 reps Composite Extension: AROM;Standing;Right;10 reps Donning/doffing sling/immobilizer: Caregiver independent with task Correct positioning of sling/immobilizer: Caregiver independent with task ROM for elbow, wrist and digits of operated UE: Supervision/safety   Shoulder Instructions Shoulder Instructions Donning/doffing sling/immobilizer: Caregiver independent with task Correct positioning of sling/immobilizer: Caregiver independent with task ROM for elbow, wrist and digits of operated UE: Supervision/safety     General Comments      Pertinent Vitals/ Pain       Pain Assessment: No/denies pain  Home Living                                          Prior Functioning/Environment  Frequency Min 2X/week     Progress Toward Goals  OT Goals(current goals can now be found in the care plan section)  Progress towards OT goals: Progressing toward goals     Plan Discharge plan remains appropriate    Co-evaluation                 End of Session     Activity Tolerance Patient tolerated treatment well   Patient Left  (pt. ambulating in hall with wife with RN aware)   Nurse Communication          [id number] OT Time Calculation (min): 30 min  Charges: OT General Charges $OT Visit: 1 Procedure OT Treatments $Therapeutic Exercise: 23-37 mins  Janice Coffin, COTA/L 09/10/2015, 8:51 AM

## 2015-09-11 ENCOUNTER — Encounter (HOSPITAL_COMMUNITY): Payer: Self-pay | Admitting: Orthopedic Surgery

## 2015-09-11 NOTE — Discharge Summary (Signed)
Physician Discharge Summary   Patient ID: Gregory Blackburn MRN: [id number] DOB/AGE: [birth year] 65 y.o.  Admit date: 2015-09-18 Discharge date: 09/10/15  Admission Diagnoses:  Active Problems:   S/P shoulder replacement   S/P shoulder hemiarthroplasty   Discharge Diagnoses:  Same   Surgeries: Procedure(s): RIGHT SHOULDER HEMI-ARTHROPLASTY  on Sep 18, 2015   Consultants: PT/OT  Discharged Condition: Stable  Hospital Course: Gregory Blackburn is an 64 y.o. male who was admitted September 18, 2015 with a chief complaint of right shoulder pain, and found to have a diagnosis of right rotator cuff arthropathy.  They were brought to the operating room on 09-18-15 and underwent the above named procedures.    The patient had an uncomplicated hospital course and was stable for discharge.  Recent vital signs:  Filed Vitals:   09/09/15 1927 09/10/15 0528  BP: 128/69 128/71  Pulse: 78 69  Temp: 98.4 F (36.9 C) 98.4 F (36.9 C)  Resp: 16 18    Recent laboratory studies:  Results for orders placed or performed during the hospital encounter of [id number]  Basic metabolic panel  Result Value Ref Range   Sodium 140 135 - 145 mmol/L   Potassium 5.0 3.5 - 5.1 mmol/L   Chloride 107 101 - 111 mmol/L   CO2 29 22 - 32 mmol/L   Glucose, Bld 99 65 - 99 mg/dL   BUN 18 6 - 20 mg/dL   Creatinine, Ser 0.84 0.61 - 1.24 mg/dL   Calcium 9.2 8.9 - 10.3 mg/dL   GFR calc non Af Amer >60 >60 mL/min   GFR calc Af Amer >60 >60 mL/min   Anion gap 4 (L) 5 - 15  CBC  Result Value Ref Range   WBC 5.5 4.0 - 10.5 K/uL   RBC 5.07 4.22 - 5.81 MIL/uL   Hemoglobin 14.1 13.0 - 17.0 g/dL   HCT 42.5 39.0 - 52.0 %   MCV 83.8 78.0 - 100.0 fL   MCH 27.8 26.0 - 34.0 pg   MCHC 33.2 30.0 - 36.0 g/dL   RDW 12.4 11.5 - 15.5 %   Platelets 167 150 - 400 K/uL    Discharge Medications:     Medication List    TAKE these medications        methocarbamol 500 MG tablet  Commonly known as:  ROBAXIN  Take 1 tablet (500 mg total) by  mouth 3 (three) times daily as needed.     oxyCODONE-acetaminophen 5-325 MG tablet  Commonly known as:  ROXICET  Take 1-2 tablets by mouth every 4 (four) hours as needed for severe pain.     SUPER GREENS Powd  Take 1 scoop by mouth daily.     ZICAM ALLERGY RELIEF NA  Place 1 spray into the nose 2 (two) times daily as needed (ALLERGIES AND CONGESTION).        Diagnostic Studies: Dg Shoulder Right Port  09-18-2015  CLINICAL DATA:  Status post shoulder replacement EXAM: PORTABLE RIGHT SHOULDER - 2+ VIEW COMPARISON:  None. FINDINGS: Changes of right shoulder replacement. Normal AP alignment. No hardware or bony complicating feature noted on this single view. IMPRESSION: Right shoulder replacement.  No visible complicating feature. Electronically Signed   By: Rolm Baptise M.D.   On: 2015/09/18 11:16    Disposition: 01-Home or Self Care      Discharge Instructions    Call MD / Call 911    Complete by:  As directed   If you experience chest pain or shortness of breath, CALL 911 and  be transported to the hospital emergency room.  If you develope a fever above 101 F, pus (white drainage) or increased drainage or redness at the wound, or calf pain, call your surgeon's office.     Constipation Prevention    Complete by:  As directed   Drink plenty of fluids.  Prune juice may be helpful.  You may use a stool softener, such as Colace (over the counter) 100 mg twice a day.  Use MiraLax (over the counter) for constipation as needed.     Diet - low sodium heart healthy    Complete by:  As directed      Increase activity slowly as tolerated    Complete by:  As directed            Follow-up Information    Follow up with NORRIS,STEVEN R, MD. Call in 2 weeks.   Specialty:  Orthopedic Surgery   Why:  [id number]   Contact information:   [address] [place] [address] [id number]        Signed: Ventura Bruns 09/11/2015, 2:01 PM

## 2019-09-07 ENCOUNTER — Ambulatory Visit: Payer: PRIVATE HEALTH INSURANCE | Admitting: Physical Therapy

## 2020-08-09 DIAGNOSIS — E7800 Pure hypercholesterolemia, unspecified: Secondary | ICD-10-CM | POA: Diagnosis not present

## 2020-08-09 DIAGNOSIS — Z0000 Encounter for general adult medical examination without abnormal findings: Secondary | ICD-10-CM | POA: Diagnosis not present

## 2020-08-14 ENCOUNTER — Other Ambulatory Visit: Payer: Self-pay | Admitting: Family Medicine

## 2020-08-14 DIAGNOSIS — E7800 Pure hypercholesterolemia, unspecified: Secondary | ICD-10-CM

## 2020-09-18 ENCOUNTER — Ambulatory Visit
Admission: RE | Admit: 2020-09-18 | Discharge: 2020-09-18 | Disposition: A | Discharge status: Home / Self Care | Payer: PRIVATE HEALTH INSURANCE | Source: Ambulatory Visit | Attending: Family Medicine | Admitting: Family Medicine

## 2020-09-18 DIAGNOSIS — E7800 Pure hypercholesterolemia, unspecified: Secondary | ICD-10-CM

## 2020-09-25 DIAGNOSIS — L508 Other urticaria: Secondary | ICD-10-CM | POA: Diagnosis not present

## 2020-11-14 ENCOUNTER — Encounter: Payer: Self-pay | Admitting: Internal Medicine

## 2020-12-12 DIAGNOSIS — L821 Other seborrheic keratosis: Secondary | ICD-10-CM | POA: Diagnosis not present

## 2020-12-12 DIAGNOSIS — D225 Melanocytic nevi of trunk: Secondary | ICD-10-CM | POA: Diagnosis not present

## 2020-12-12 DIAGNOSIS — D485 Neoplasm of uncertain behavior of skin: Secondary | ICD-10-CM | POA: Diagnosis not present

## 2020-12-12 DIAGNOSIS — L814 Other melanin hyperpigmentation: Secondary | ICD-10-CM | POA: Diagnosis not present

## 2020-12-12 DIAGNOSIS — L988 Other specified disorders of the skin and subcutaneous tissue: Secondary | ICD-10-CM | POA: Diagnosis not present

## 2020-12-12 DIAGNOSIS — Z85828 Personal history of other malignant neoplasm of skin: Secondary | ICD-10-CM | POA: Diagnosis not present

## 2020-12-12 DIAGNOSIS — L578 Other skin changes due to chronic exposure to nonionizing radiation: Secondary | ICD-10-CM | POA: Diagnosis not present

## 2020-12-12 DIAGNOSIS — L570 Actinic keratosis: Secondary | ICD-10-CM | POA: Diagnosis not present

## 2020-12-12 DIAGNOSIS — C44319 Basal cell carcinoma of skin of other parts of face: Secondary | ICD-10-CM | POA: Diagnosis not present

## 2021-03-01 DIAGNOSIS — D485 Neoplasm of uncertain behavior of skin: Secondary | ICD-10-CM | POA: Diagnosis not present

## 2021-03-01 DIAGNOSIS — C44319 Basal cell carcinoma of skin of other parts of face: Secondary | ICD-10-CM | POA: Diagnosis not present

## 2021-03-01 DIAGNOSIS — L905 Scar conditions and fibrosis of skin: Secondary | ICD-10-CM | POA: Diagnosis not present

## 2021-04-12 DIAGNOSIS — L570 Actinic keratosis: Secondary | ICD-10-CM | POA: Diagnosis not present

## 2021-05-29 ENCOUNTER — Encounter (HOSPITAL_BASED_OUTPATIENT_CLINIC_OR_DEPARTMENT_OTHER): Payer: Self-pay | Admitting: Internal Medicine

## 2021-05-29 ENCOUNTER — Ambulatory Visit (INDEPENDENT_AMBULATORY_CARE_PROVIDER_SITE_OTHER): Payer: Medicare Other | Admitting: Internal Medicine

## 2021-05-29 ENCOUNTER — Telehealth: Payer: Self-pay | Admitting: Internal Medicine

## 2021-05-29 ENCOUNTER — Other Ambulatory Visit: Payer: Self-pay

## 2021-05-29 VITALS — BP 119/80 | HR 78 | Ht 68.0 in | Wt 152.8 lb

## 2021-05-29 DIAGNOSIS — E785 Hyperlipidemia, unspecified: Secondary | ICD-10-CM

## 2021-05-29 DIAGNOSIS — R931 Abnormal findings on diagnostic imaging of heart and coronary circulation: Secondary | ICD-10-CM

## 2021-05-29 MED ORDER — ROSUVASTATIN CALCIUM 5 MG PO TABS: 5.0000 mg | ORAL_TABLET | Freq: Every day | ORAL | 3 refills | Status: DC

## 2021-05-29 NOTE — Progress Notes (Signed)
LIPID CLINIC CONSULT NOTE  Chief Complaint:  Manage dyslipidemia  Primary Care Physician: Glenis Smoker, MD  Primary Cardiologist:  None  HPI:  Gregory Blackburn is a 71 y.o. male who is being seen today for the evaluation of dyslipidemia at the request of Glenis Smoker, *.  This is a 71 year old male kindly referred for evaluation management of dyslipidemia.  He recently had a coronary calcium score which was 47, 48th percentile for age and sex matched controls.  He has not previously been on any lipid-lowering therapy.  He had some concerns about it and takes a number of over-the-counter supplements and vitamins including red yeast rice.  With this treatment he was able to bring down cholesterol somewhat based on labs that demonstrated a reduction in total cholesterol from 271-235 over the past year and reduction in LDL cholesterol from 202 down to 157.  This is certainly an improvement however I would argue that his target LDL should probably be at least 100 or lower.  Although he does have coronary calcification it is lower risk based on his percentile normalization.  This would put him at average coronary risk for his age.  He has no other significant risk factors including no history of hypertension, diabetes, smoking or other issues.  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  PMHx:  Past Medical History:  Diagnosis Date   Arthritis    right shoulder   Heart murmur    as a kid, gone now    Past Surgical History:  Procedure Laterality Date   ANKLE SURGERY     left   APPENDECTOMY  2004   COLONOSCOPY     NASAL SEPTUM SURGERY     SHOULDER HEMI-ARTHROPLASTY Right 09/08/2015   Procedure: RIGHT SHOULDER HEMI-ARTHROPLASTY ;  Surgeon: Netta Cedars, MD;  Location: [facility];  Service: Orthopedics;  Laterality: Right;    FAMHx:  No family history on file.  No history of familial hyperlipidemia  SOCHx:   reports that he has  never smoked. He has never used smokeless tobacco. He reports current alcohol use. He reports that he does not use drugs.  ALLERGIES:  Allergies  Allergen Reactions   Ciprofloxacin Other (See Comments)    PT WORKS IN THE SUN, AND MEDICATION GIVES SUN SENSITIVITY      ROS: Pertinent items noted in HPI and remainder of comprehensive ROS otherwise negative.  HOME MEDS: Current Outpatient Medications on File Prior to Visit  Medication Sig Dispense Refill   Acetylcysteine (NAC PO) Take by mouth.     Ascorbic Acid (VITAMIN C) 100 MG tablet Take 100 mg by mouth daily.     cholecalciferol (VITAMIN D3) 25 MCG (1000 UNIT) tablet Take 1,000 Units by mouth daily.     glucosamine-chondroitin 500-400 MG tablet Take 1 tablet by mouth 3 (three) times daily.     Homeopathic Products (ZICAM ALLERGY RELIEF NA) Place 1 spray into the nose 2 (two) times daily as needed (ALLERGIES AND CONGESTION).     Misc Natural Products (SUPER GREENS) POWD Take 1 scoop by mouth daily.     Omega-3 Fatty Acids (OMEGA III EPA+DHA) 1000 MG CAPS Take by mouth.     Protein POWD Take by mouth.     Turmeric (CURCUMIN 95) 500 MG CAPS Take by mouth.     vitamin E 180 MG (400 UNITS) capsule Take 400 Units by mouth daily.     No  current facility-administered medications on file prior to visit.    LABS/IMAGING: No results found for this or any previous visit (from the past 48 hour(s)). No results found.  LIPID PANEL: No results found for: CHOL, TRIG, HDL, CHOLHDL, VLDL, LDLCALC, LDLDIRECT  WEIGHTS: Wt Readings from Last 3 Encounters:  05/29/21 152 lb 12.8 oz (69.3 kg)  09/08/15 151 lb (68.5 kg)  09/01/15 151 lb 11.2 oz (68.8 kg)    VITALS: BP 119/80    Pulse 78    Ht 5\' 8"  (1.727 m)    Wt 152 lb 12.8 oz (69.3 kg)    SpO2 97%    BMI 23.23 kg/m   EXAM: Deferred  EKG: Deferred  ASSESSMENT: Mixed dyslipidemia, goal LDL less than 100 Elevated CAC score 105, 48th percentile for age and sex matched  controls  PLAN: 1.   Mr. Elamin has a few traditional cardiovascular good risk factors but an elevated coronary calcium score although only 48th percentile.  This puts him at about average risk for his age.  He is currently on red yeast rice and is actually a significant reduction in his lipids.  His cholesterol is quite high suggestive of possible familial hyperlipidemia.  He is not aware of any significant heart disease in his family.  We had a long and academic discussion about the risks and benefits of statin medications and he seemed ultimately agreeable to trying it.  I think if we can go with low-dose rosuvastatin 5 mg daily and get more cholesterol lowering.  I would advise though that he stop his red yeast rice.  The combination of both may increase his risk of myalgias.  He can continue on co-Q10.  We will plan repeat lipids in 3 to 4 months and follow-up afterwards.  Gregory Casino, MD, [facility], [place] Director of the Advanced Lipid Disorders &  Cardiovascular Risk Reduction Clinic Diplomate of the American Board of Clinical Lipidology Attending Cardiologist  Direct Dial: [phone]   Fax: [phone]  Website:  [email or web address] 05/29/2021, 1:56 PM

## 2021-05-29 NOTE — Telephone Encounter (Signed)
Patient seen in office today -- it is advised that he STOP red yeast rice since he was prescribed statin today. Left message with this info on name-verified VM

## 2021-05-29 NOTE — Patient Instructions (Addendum)
Medication Instructions:  START rosuvastatin (crestor) 5mg  daily   STOP red yeast rice  *If you need a refill on your cardiac medications before your next appointment, please call your pharmacy*   Lab Work: FASTING lab work to check cholesterol in about 3 months   If you have labs (blood work) drawn today and your tests are completely normal, you will receive your results only by: [place] (if you have MyChart) OR A paper copy in the mail If you have any lab test that is abnormal or we need to change your treatment, we will call you to review the results.   Testing/Procedures: NONE   Follow-Up: At [facility], you and your health needs are our priority.  As part of our continuing mission to provide you with exceptional heart care, we have created designated Provider Care Teams.  These Care Teams include your primary Cardiologist (physician) and Advanced Practice Providers (APPs -  Physician Assistants and Nurse Practitioners) who all work together to provide you with the care you need, when you need it.  We recommend signing up for the patient portal called "MyChart".  Sign up information is provided on this After Visit Summary.  MyChart is used to connect with patients for Virtual Visits (Telemedicine).  Patients are able to view lab/test results, encounter notes, upcoming appointments, etc.  Non-urgent messages can be sent to your provider as well.   To learn more about what you can do with MyChart, go to [email or web address].    Your next appointment:    3-4 months with Dr. Debara Pickett

## 2021-08-08 DIAGNOSIS — E785 Hyperlipidemia, unspecified: Secondary | ICD-10-CM | POA: Diagnosis not present

## 2021-08-09 LAB — LIPID PANEL
Chol/HDL Ratio: 4.1 ratio (ref 0.0–5.0)
Cholesterol, Total: 207 mg/dL — ABNORMAL HIGH (ref 100–199)
HDL: 51 mg/dL (ref >39)
LDL Chol Calc (NIH): 135 mg/dL — ABNORMAL HIGH (ref 0–99)
Triglycerides: 116 mg/dL (ref 0–149)
VLDL Cholesterol Cal: 21 mg/dL (ref 5–40)

## 2021-08-16 ENCOUNTER — Ambulatory Visit (INDEPENDENT_AMBULATORY_CARE_PROVIDER_SITE_OTHER): Payer: Medicare Other | Admitting: Internal Medicine

## 2021-08-16 ENCOUNTER — Encounter: Payer: Self-pay | Admitting: Internal Medicine

## 2021-08-16 VITALS — BP 124/72 | HR 70 | Ht 68.0 in | Wt 153.6 lb

## 2021-08-16 DIAGNOSIS — E785 Hyperlipidemia, unspecified: Secondary | ICD-10-CM | POA: Diagnosis not present

## 2021-08-16 DIAGNOSIS — R931 Abnormal findings on diagnostic imaging of heart and coronary circulation: Secondary | ICD-10-CM

## 2021-08-16 MED ORDER — ROSUVASTATIN CALCIUM 10 MG PO TABS: 10.0000 mg | ORAL_TABLET | Freq: Every day | ORAL | 3 refills | Status: DC

## 2021-08-16 NOTE — Progress Notes (Signed)
LIPID CLINIC CONSULT NOTE  Chief Complaint:  Follow-up dyslipidemia  Primary Care Physician: Glenis Smoker, MD  Primary Cardiologist:  None  HPI:  Gregory Blackburn is a 71 y.o. male who is being seen today for the evaluation of dyslipidemia at the request of Glenis Smoker, *.  This is a 71 year old male kindly referred for evaluation management of dyslipidemia.  He recently had a coronary calcium score which was 23, 48th percentile for age and sex matched controls.  He has not previously been on any lipid-lowering therapy.  He had some concerns about it and takes a number of over-the-counter supplements and vitamins including red yeast rice.  With this treatment he was able to bring down cholesterol somewhat based on labs that demonstrated a reduction in total cholesterol from 271-235 over the past year and reduction in LDL cholesterol from 202 down to 157.  This is certainly an improvement however I would argue that his target LDL should probably be at least 100 or lower.  Although he does have coronary calcification it is lower risk based on his percentile normalization.  This would put him at average coronary risk for his age.  He has no other significant risk factors including no history of hypertension, diabetes, smoking or other issues.  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  08/16/2021  Gregory Blackburn returns today for follow-up.  He has had some interval improvement in his lipids however the cholesterol is not as low as it should be for maximal cardiovascular benefit.  His LDLs come down from 157 and 135, total cholesterol 207, triglycerides 116 and HDL 51.  Part of this change may be due to the fact that he transition from twice daily red yeast rice over to low-dose rosuvastatin 5 mg daily.  He has reduction in cholesterol was less than expected.  He reports he actually has been missing some doses although it sounds like it  is more infrequent rather than frequent.  This has a lot to do with his travel schedule.  PMHx:  Past Medical History:  Diagnosis Date   Arthritis    right shoulder   Heart murmur    as a kid, gone now    Past Surgical History:  Procedure Laterality Date   ANKLE SURGERY     left   APPENDECTOMY  2004   COLONOSCOPY     NASAL SEPTUM SURGERY     SHOULDER HEMI-ARTHROPLASTY Right 09/08/2015   Procedure: RIGHT SHOULDER HEMI-ARTHROPLASTY ;  Surgeon: Netta Cedars, MD;  Location: [place];  Service: Orthopedics;  Laterality: Right;    FAMHx:  No family history on file.  No history of familial hyperlipidemia  SOCHx:   reports that he has never smoked. He has never used smokeless tobacco. He reports current alcohol use. He reports that he does not use drugs.  ALLERGIES:  Allergies  Allergen Reactions   Ciprofloxacin Other (See Comments)    PT WORKS IN THE SUN, AND MEDICATION GIVES SUN SENSITIVITY      ROS: Pertinent items noted in HPI and remainder of comprehensive ROS otherwise negative.  HOME MEDS: Current Outpatient Medications on File Prior to Visit  Medication Sig Dispense Refill   Acetylcysteine (NAC PO) Take by mouth.     Ascorbic Acid (VITAMIN C) 100 MG tablet Take 100 mg by mouth daily.     cholecalciferol (VITAMIN D3) 25 MCG (1000 UNIT) tablet Take 1,000 Units by  mouth daily.     glucosamine-chondroitin 500-400 MG tablet Take 1 tablet by mouth 3 (three) times daily.     Homeopathic Products (ZICAM ALLERGY RELIEF NA) Place 1 spray into the nose 2 (two) times daily as needed (ALLERGIES AND CONGESTION).     Misc Natural Products (SUPER GREENS) POWD Take 1 scoop by mouth daily.     Omega-3 Fatty Acids (OMEGA III EPA+DHA) 1000 MG CAPS Take by mouth.     Protein POWD Take by mouth.     rosuvastatin (CRESTOR) 5 MG tablet Take 1 tablet (5 mg total) by mouth daily. 90 tablet 3   Turmeric 500 MG CAPS Take by mouth.     vitamin E 180 MG (400 UNITS) capsule Take 400 Units by mouth  daily.     No current facility-administered medications on file prior to visit.    LABS/IMAGING: No results found for this or any previous visit (from the past 48 hour(s)). No results found.  LIPID PANEL:    Component Value Date/Time   CHOL 207 (H) 08/08/2021 0823   TRIG 116 08/08/2021 0823   HDL 51 08/08/2021 0823   CHOLHDL 4.1 08/08/2021 0823   LDLCALC 135 (H) 08/08/2021 0823    WEIGHTS: Wt Readings from Last 3 Encounters:  08/16/21 153 lb 9.6 oz (69.7 kg)  05/29/21 152 lb 12.8 oz (69.3 kg)  09/08/15 151 lb (68.5 kg)    VITALS: BP 124/72   Pulse 70   Ht '5\' 8"'$  (1.727 m)   Wt 153 lb 9.6 oz (69.7 kg)   SpO2 98%   BMI 23.35 kg/m   EXAM: Deferred  EKG: Deferred  ASSESSMENT: Mixed dyslipidemia, goal LDL less than 100 Elevated CAC score 105, 48th percentile for age and sex matched controls  PLAN: 1.   Gregory Blackburn remains above his goal LDL less than 100.  He has had some missed doses due to his travel schedule.  I suspect his cholesterol probably could go a little lower with more regular use or perhaps even higher dose.  After discussing this he was agreeable to increasing the rosuvastatin from 5 to 10 mg daily.  We will get him a new prescription, but for now, he could double his dose.  We will plan repeat lipids in 3 to 4 months and follow-up at that time.  Pixie Casino, MD, [facility], [place] Director of the Advanced Lipid Disorders &  Cardiovascular Risk Reduction Clinic Diplomate of the American Board of Clinical Lipidology Attending Cardiologist  Direct Dial: [phone]  Fax: [phone]  Website:  [email or web address] 08/16/2021, 11:37 AM

## 2021-08-16 NOTE — Patient Instructions (Signed)
Medication Instructions:  INCREASE rosuvastatin to '10mg'$  daily  *If you need a refill on your cardiac medications before your next appointment, please call your pharmacy*   Lab Work: FASTING lab work to check cholesterol in 3-4 months -- before next visit with Dr. Debara Pickett   If you have labs (blood work) drawn today and your tests are completely normal, you will receive your results only by: [place] (if you have MyChart) OR A paper copy in the mail If you have any lab test that is abnormal or we need to change your treatment, we will call you to review the results.   Testing/Procedures: NONE   Follow-Up: At [facility], you and your health needs are our priority.  As part of our continuing mission to provide you with exceptional heart care, we have created designated Provider Care Teams.  These Care Teams include your primary Cardiologist (physician) and Advanced Practice Providers (APPs -  Physician Assistants and Nurse Practitioners) who all work together to provide you with the care you need, when you need it.  We recommend signing up for the patient portal called "MyChart".  Sign up information is provided on this After Visit Summary.  MyChart is used to connect with patients for Virtual Visits (Telemedicine).  Patients are able to view lab/test results, encounter notes, upcoming appointments, etc.  Non-urgent messages can be sent to your provider as well.   To learn more about what you can do with MyChart, go to [email or web address].    Your next appointment:   3-4 months with Dr. Debara Pickett -- lipid clinic

## 2021-08-29 DIAGNOSIS — Z79899 Other long term (current) drug therapy: Secondary | ICD-10-CM | POA: Diagnosis not present

## 2021-08-29 DIAGNOSIS — E7800 Pure hypercholesterolemia, unspecified: Secondary | ICD-10-CM | POA: Diagnosis not present

## 2021-08-29 DIAGNOSIS — Z23 Encounter for immunization: Secondary | ICD-10-CM | POA: Diagnosis not present

## 2021-08-29 DIAGNOSIS — Z0000 Encounter for general adult medical examination without abnormal findings: Secondary | ICD-10-CM | POA: Diagnosis not present

## 2021-12-06 DIAGNOSIS — E785 Hyperlipidemia, unspecified: Secondary | ICD-10-CM | POA: Diagnosis not present

## 2021-12-07 LAB — LIPID PANEL
Chol/HDL Ratio: 3.4 ratio (ref 0.0–5.0)
Cholesterol, Total: 173 mg/dL (ref 100–199)
HDL: 51 mg/dL (ref >39)
LDL Chol Calc (NIH): 112 mg/dL — ABNORMAL HIGH (ref 0–99)
Triglycerides: 49 mg/dL (ref 0–149)
VLDL Cholesterol Cal: 10 mg/dL (ref 5–40)

## 2021-12-14 ENCOUNTER — Ambulatory Visit: Payer: Medicare Other | Attending: Internal Medicine | Admitting: Internal Medicine

## 2021-12-14 ENCOUNTER — Encounter: Payer: Self-pay | Admitting: Internal Medicine

## 2021-12-14 VITALS — BP 143/85 | HR 69 | Ht 68.0 in | Wt 150.2 lb

## 2021-12-14 DIAGNOSIS — E785 Hyperlipidemia, unspecified: Secondary | ICD-10-CM | POA: Diagnosis not present

## 2021-12-14 DIAGNOSIS — R931 Abnormal findings on diagnostic imaging of heart and coronary circulation: Secondary | ICD-10-CM

## 2021-12-14 MED ORDER — ROSUVASTATIN CALCIUM 20 MG PO TABS: 20.0000 mg | ORAL_TABLET | Freq: Every day | ORAL | 3 refills | Status: DC

## 2021-12-14 NOTE — Patient Instructions (Signed)
Medication Instructions:   INCREASE rosuvastatin to '20mg'$  daily  -- you can take two of the '10mg'$  tablets until you run out -- a new prescription for '20mg'$  tablets has been sent to [facility]  *If you need a refill on your cardiac medications before your next appointment, please call your pharmacy*   Lab Work: FASTING lab work to check cholesterol in about 6 months   If you have labs (blood work) drawn today and your tests are completely normal, you will receive your results only by: [place] (if you have [place]) OR A paper copy in the mail If you have any lab test that is abnormal or we need to change your treatment, we will call you to review the results.   Testing/Procedures: NONE   Follow-Up: At [facility], you and your health needs are our priority.  As part of our continuing mission to provide you with exceptional heart care, we have created designated Provider Care Teams.  These Care Teams include your primary Cardiologist (physician) and Advanced Practice Providers (APPs -  Physician Assistants and Nurse Practitioners) who all work together to provide you with the care you need, when you need it.  We recommend signing up for the patient portal called "MyChart".  Sign up information is provided on this After Visit Summary.  MyChart is used to connect with patients for Virtual Visits (Telemedicine).  Patients are able to view lab/test results, encounter notes, upcoming appointments, etc.  Non-urgent messages can be sent to your provider as well.   To learn more about what you can do with MyChart, go to [email or web address].    Your next appointment:    6 months with Dr. Debara Pickett

## 2021-12-14 NOTE — Progress Notes (Signed)
LIPID CLINIC CONSULT NOTE  Chief Complaint:  Follow-up dyslipidemia  Primary Care Physician: Glenis Smoker, MD  Primary Cardiologist:  None  HPI:  Gregory Blackburn is a 71 y.o. male who is being seen today for the evaluation of dyslipidemia at the request of Glenis Smoker, *.  This is a 71 year old male kindly referred for evaluation management of dyslipidemia.  He recently had a coronary calcium score which was 3, 48th percentile for age and sex matched controls.  He has not previously been on any lipid-lowering therapy.  He had some concerns about it and takes a number of over-the-counter supplements and vitamins including red yeast rice.  With this treatment he was able to bring down cholesterol somewhat based on labs that demonstrated a reduction in total cholesterol from 271-235 over the past year and reduction in LDL cholesterol from 202 down to 157.  This is certainly an improvement however I would argue that his target LDL should probably be at least 100 or lower.  Although he does have coronary calcification it is lower risk based on his percentile normalization.  This would put him at average coronary risk for his age.  He has no other significant risk factors including no history of hypertension, diabetes, smoking or other issues.  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  08/16/2021  Gregory Blackburn returns today for follow-up.  He has had some interval improvement in his lipids however the cholesterol is not as low as it should be for maximal cardiovascular benefit.  His LDLs come down from 157 and 135, total cholesterol 207, triglycerides 116 and HDL 51.  Part of this change may be due to the fact that he transition from twice daily red yeast rice over to low-dose rosuvastatin 5 mg daily.  He has reduction in cholesterol was less than expected.  He reports he actually has been missing some doses although it sounds like it  is more infrequent rather than frequent.  This has a lot to do with his travel schedule.  12/14/2021  Gregory Blackburn is seen today in follow-up. He has been taking Crestor 10 mg daily and sometimes takes it twice daily. He seems to tolerate it well. Cholesterol is further improved. LDL now 112 (down from 135) and TC 173, down form 207.  PMHx:  Past Medical History:  Diagnosis Date   Arthritis    right shoulder   Heart murmur    as a kid, gone now    Past Surgical History:  Procedure Laterality Date   ANKLE SURGERY     left   APPENDECTOMY  2004   COLONOSCOPY     NASAL SEPTUM SURGERY     SHOULDER HEMI-ARTHROPLASTY Right 09/08/2015   Procedure: RIGHT SHOULDER HEMI-ARTHROPLASTY ;  Surgeon: Netta Cedars, MD;  Location: [place];  Service: Orthopedics;  Laterality: Right;    FAMHx:  No family history on file.  No history of familial hyperlipidemia  SOCHx:   reports that he has never smoked. He has never used smokeless tobacco. He reports current alcohol use. He reports that he does not use drugs.  ALLERGIES:  Allergies  Allergen Reactions   Ciprofloxacin Other (See Comments)    PT WORKS IN THE SUN, AND MEDICATION GIVES SUN SENSITIVITY      ROS: Pertinent items noted in HPI and remainder of comprehensive ROS otherwise negative.  HOME MEDS: Current Outpatient Medications on File Prior to Visit  Medication Sig Dispense Refill   Acetylcysteine (NAC PO) Take by mouth.     Ascorbic Acid (VITAMIN C) 100 MG tablet Take 100 mg by mouth daily.     cholecalciferol (VITAMIN D3) 25 MCG (1000 UNIT) tablet Take 1,000 Units by mouth daily.     glucosamine-chondroitin 500-400 MG tablet Take 1 tablet by mouth 3 (three) times daily.     Homeopathic Products (ZICAM ALLERGY RELIEF NA) Place 1 spray into the nose 2 (two) times daily as needed (ALLERGIES AND CONGESTION).     Misc Natural Products (SUPER GREENS) POWD Take 1 scoop by mouth daily.     Omega-3 Fatty Acids (OMEGA III EPA+DHA) 1000 MG CAPS  Take by mouth.     Protein POWD Take by mouth.     rosuvastatin (CRESTOR) 10 MG tablet Take 1 tablet (10 mg total) by mouth daily. 90 tablet 3   Turmeric 500 MG CAPS Take by mouth.     vitamin E 180 MG (400 UNITS) capsule Take 400 Units by mouth daily.     No current facility-administered medications on file prior to visit.    LABS/IMAGING: No results found for this or any previous visit (from the past 48 hour(s)). No results found.  LIPID PANEL:    Component Value Date/Time   CHOL 173 12/06/2021 0850   TRIG 49 12/06/2021 0850   HDL 51 12/06/2021 0850   CHOLHDL 3.4 12/06/2021 0850   LDLCALC 112 (H) 12/06/2021 0850    WEIGHTS: Wt Readings from Last 3 Encounters:  12/14/21 150 lb 3.2 oz (68.1 kg)  08/16/21 153 lb 9.6 oz (69.7 kg)  05/29/21 152 lb 12.8 oz (69.3 kg)    VITALS: BP (!) 143/85   Pulse 69   Ht '5\' 8"'$  (1.727 m)   Wt 150 lb 3.2 oz (68.1 kg)   SpO2 97%   BMI 22.84 kg/m   EXAM: Deferred  EKG: Deferred  ASSESSMENT: Mixed dyslipidemia, goal LDL less than 100 Elevated CAC score 105, 48th percentile for age and sex matched controls  PLAN: 1.   Gregory Blackburn has had further reduction in his lipids on Crestor 10 mg daily and seems to tolerate it well - sometimes he takes 20 mg without issues. He is willing to bump up to 20 mg daily and I think we will achieve our target LDL at that dose. Follow-up with repeat lipid in 6 months.  Pixie Casino, MD, [facility], [place] Director of the Advanced Lipid Disorders &  Cardiovascular Risk Reduction Clinic Diplomate of the American Board of Clinical Lipidology Attending Cardiologist  Direct Dial: [phone]  Fax: [phone]  Website:  [email or web address] 12/14/2021, 10:36 AM

## 2021-12-24 DIAGNOSIS — D0439 Carcinoma in situ of skin of other parts of face: Secondary | ICD-10-CM | POA: Diagnosis not present

## 2021-12-24 DIAGNOSIS — L821 Other seborrheic keratosis: Secondary | ICD-10-CM | POA: Diagnosis not present

## 2021-12-24 DIAGNOSIS — D225 Melanocytic nevi of trunk: Secondary | ICD-10-CM | POA: Diagnosis not present

## 2021-12-24 DIAGNOSIS — L578 Other skin changes due to chronic exposure to nonionizing radiation: Secondary | ICD-10-CM | POA: Diagnosis not present

## 2021-12-24 DIAGNOSIS — L570 Actinic keratosis: Secondary | ICD-10-CM | POA: Diagnosis not present

## 2021-12-24 DIAGNOSIS — L820 Inflamed seborrheic keratosis: Secondary | ICD-10-CM | POA: Diagnosis not present

## 2021-12-24 DIAGNOSIS — D485 Neoplasm of uncertain behavior of skin: Secondary | ICD-10-CM | POA: Diagnosis not present

## 2021-12-24 DIAGNOSIS — L814 Other melanin hyperpigmentation: Secondary | ICD-10-CM | POA: Diagnosis not present

## 2021-12-31 DIAGNOSIS — D0439 Carcinoma in situ of skin of other parts of face: Secondary | ICD-10-CM | POA: Diagnosis not present

## 2022-01-04 ENCOUNTER — Ambulatory Visit: Payer: Medicare Other | Admitting: Internal Medicine

## 2022-03-11 DIAGNOSIS — Z85828 Personal history of other malignant neoplasm of skin: Secondary | ICD-10-CM | POA: Diagnosis not present

## 2022-03-11 DIAGNOSIS — Z5189 Encounter for other specified aftercare: Secondary | ICD-10-CM | POA: Diagnosis not present

## 2022-05-08 DIAGNOSIS — K120 Recurrent oral aphthae: Secondary | ICD-10-CM | POA: Diagnosis not present

## 2022-05-31 DIAGNOSIS — E785 Hyperlipidemia, unspecified: Secondary | ICD-10-CM | POA: Diagnosis not present

## 2022-06-01 LAB — LIPID PANEL
Chol/HDL Ratio: 5.3 ratio — ABNORMAL HIGH (ref 0.0–5.0)
Cholesterol, Total: 144 mg/dL (ref 100–199)
HDL: 27 mg/dL — ABNORMAL LOW (ref >39)
LDL Chol Calc (NIH): 97 mg/dL (ref 0–99)
Triglycerides: 109 mg/dL (ref 0–149)
VLDL Cholesterol Cal: 20 mg/dL (ref 5–40)

## 2022-06-05 ENCOUNTER — Encounter: Payer: Self-pay | Admitting: Internal Medicine

## 2022-06-05 ENCOUNTER — Ambulatory Visit: Payer: Medicare Other | Attending: Internal Medicine | Admitting: Internal Medicine

## 2022-06-05 VITALS — BP 130/64 | HR 60 | Ht 68.0 in | Wt 153.0 lb

## 2022-06-05 DIAGNOSIS — E785 Hyperlipidemia, unspecified: Secondary | ICD-10-CM

## 2022-06-05 DIAGNOSIS — R931 Abnormal findings on diagnostic imaging of heart and coronary circulation: Secondary | ICD-10-CM

## 2022-06-05 NOTE — Progress Notes (Signed)
LIPID CLINIC CONSULT NOTE  Chief Complaint:  Follow-up dyslipidemia  Primary Care Physician: Glenis Smoker, MD  Primary Cardiologist:  None  HPI:  Gregory Blackburn is a 72 y.o. male who is being seen today for the evaluation of dyslipidemia at the request of Glenis Smoker, *.  This is a 72 year old male kindly referred for evaluation management of dyslipidemia.  He recently had a coronary calcium score which was 55, 48th percentile for age and sex matched controls.  He has not previously been on any lipid-lowering therapy.  He had some concerns about it and takes a number of over-the-counter supplements and vitamins including red yeast rice.  With this treatment he was able to bring down cholesterol somewhat based on labs that demonstrated a reduction in total cholesterol from 271-235 over the past year and reduction in LDL cholesterol from 202 down to 157.  This is certainly an improvement however I would argue that his target LDL should probably be at least 100 or lower.  Although he does have coronary calcification it is lower risk based on his percentile normalization.  This would put him at average coronary risk for his age.  He has no other significant risk factors including no history of hypertension, diabetes, smoking or other issues.  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  08/16/2021  Gregory Blackburn returns today for follow-up.  He has had some interval improvement in his lipids however the cholesterol is not as low as it should be for maximal cardiovascular benefit.  His LDLs come down from 157 and 135, total cholesterol 207, triglycerides 116 and HDL 51.  Part of this change may be due to the fact that he transition from twice daily red yeast rice over to low-dose rosuvastatin 5 mg daily.  He has reduction in cholesterol was less than expected.  He reports he actually has been missing some doses although it sounds like it is  more infrequent rather than frequent.  This has a lot to do with his travel schedule.  12/14/2021  Gregory Blackburn is seen today in follow-up. He has been taking Crestor 10 mg daily and sometimes takes it twice daily. He seems to tolerate it well. Cholesterol is further improved. LDL now 112 (down from 135) and TC 173, down form 207.  06/05/2022  Gregory Blackburn returns for follow-up. He had increased his Crestor to 20 mg daily. He is feeling well. Cholesterol has improved since his last visit. LDL is now 97, down form 112 previously. TC is 144.  PMHx:  Past Medical History:  Diagnosis Date   Arthritis    right shoulder   Heart murmur    as a kid, gone now    Past Surgical History:  Procedure Laterality Date   ANKLE SURGERY     left   APPENDECTOMY  2004   COLONOSCOPY     NASAL SEPTUM SURGERY     SHOULDER HEMI-ARTHROPLASTY Right 09/08/2015   Procedure: RIGHT SHOULDER HEMI-ARTHROPLASTY ;  Surgeon: Netta Cedars, MD;  Location: [place];  Service: Orthopedics;  Laterality: Right;    FAMHx:  History reviewed. No pertinent family history.  No history of familial hyperlipidemia  SOCHx:   reports that he has never smoked. He has never used smokeless tobacco. He reports current alcohol use. He reports that he does not use drugs.  ALLERGIES:  Allergies  Allergen Reactions   Ciprofloxacin Other (See Comments)  PT WORKS IN THE SUN, AND MEDICATION GIVES SUN SENSITIVITY      ROS: Pertinent items noted in HPI and remainder of comprehensive ROS otherwise negative.  HOME MEDS: Current Outpatient Medications on File Prior to Visit  Medication Sig Dispense Refill   Acetylcysteine (NAC PO) Take by mouth.     Ascorbic Acid (VITAMIN C) 100 MG tablet Take 100 mg by mouth daily.     cholecalciferol (VITAMIN D3) 25 MCG (1000 UNIT) tablet Take 1,000 Units by mouth daily.     glucosamine-chondroitin 500-400 MG tablet Take 1 tablet by mouth 3 (three) times daily.     Homeopathic Products (ZICAM ALLERGY  RELIEF NA) Place 1 spray into the nose 2 (two) times daily as needed (ALLERGIES AND CONGESTION).     Misc Natural Products (SUPER GREENS) POWD Take 1 scoop by mouth daily.     Omega-3 Fatty Acids (OMEGA III EPA+DHA) 1000 MG CAPS Take by mouth.     Protein POWD Take by mouth.     rosuvastatin (CRESTOR) 20 MG tablet Take 1 tablet (20 mg total) by mouth daily. 90 tablet 3   Turmeric 500 MG CAPS Take by mouth.     vitamin E 180 MG (400 UNITS) capsule Take 400 Units by mouth daily.     No current facility-administered medications on file prior to visit.    LABS/IMAGING: No results found for this or any previous visit (from the past 48 hour(s)). No results found.  LIPID PANEL:    Component Value Date/Time   CHOL 144 05/31/2022 1126   TRIG 109 05/31/2022 1126   HDL 27 (L) 05/31/2022 1126   CHOLHDL 5.3 (H) 05/31/2022 1126   LDLCALC 97 05/31/2022 1126    WEIGHTS: Wt Readings from Last 3 Encounters:  06/05/22 153 lb (69.4 kg)  12/14/21 150 lb 3.2 oz (68.1 kg)  08/16/21 153 lb 9.6 oz (69.7 kg)    VITALS: BP 130/64 (BP Location: Left Arm, Patient Position: Sitting, Cuff Size: Normal)   Pulse 60   Ht '5\' 8"'$  (1.727 m)   Wt 153 lb (69.4 kg)   BMI 23.26 kg/m   EXAM: Deferred  EKG: Deferred  ASSESSMENT: Mixed dyslipidemia, goal LDL less than 100 Elevated CAC score 105, 48th percentile for age and sex matched controls  PLAN: 1.   [name] has cholesterol which is now at goal today - he is doing well on his current regimen of Crestor 20 mg daily. Would continue current therapy. Plan follow-up annually with a repeat lipid profile.  Pixie Casino, MD, [facility], [place] Director of the Advanced Lipid Disorders &  Cardiovascular Risk Reduction Clinic Diplomate of the American Board of Clinical Lipidology Attending Cardiologist  Direct Dial: [phone]  Fax: [phone]  Website:  [email or web address] 06/05/2022, 9:37 AM

## 2022-06-05 NOTE — Patient Instructions (Signed)
Medication Instructions:  NO CHANGES  *If you need a refill on your cardiac medications before your next appointment, please call your pharmacy*   Lab Work: FASTING lipid panel in 1 year   If you have labs (blood work) drawn today and your tests are completely normal, you will receive your results only by: [place] (if you have MyChart) OR A paper copy in the mail If you have any lab test that is abnormal or we need to change your treatment, we will call you to review the results.   Follow-Up: At [facility], you and your health needs are our priority.  As part of our continuing mission to provide you with exceptional heart care, we have created designated Provider Care Teams.  These Care Teams include your primary Cardiologist (physician) and Advanced Practice Providers (APPs -  Physician Assistants and Nurse Practitioners) who all work together to provide you with the care you need, when you need it.  We recommend signing up for the patient portal called "MyChart".  Sign up information is provided on this After Visit Summary.  MyChart is used to connect with patients for Virtual Visits (Telemedicine).  Patients are able to view lab/test results, encounter notes, upcoming appointments, etc.  Non-urgent messages can be sent to your provider as well.   To learn more about what you can do with MyChart, go to [email or web address].    Your next appointment:    12 months with Dr. Debara Pickett

## 2022-09-11 DIAGNOSIS — R7301 Impaired fasting glucose: Secondary | ICD-10-CM | POA: Diagnosis not present

## 2022-09-11 DIAGNOSIS — Z0000 Encounter for general adult medical examination without abnormal findings: Secondary | ICD-10-CM | POA: Diagnosis not present

## 2022-09-11 DIAGNOSIS — E7800 Pure hypercholesterolemia, unspecified: Secondary | ICD-10-CM | POA: Diagnosis not present

## 2022-09-11 DIAGNOSIS — M25511 Pain in right shoulder: Secondary | ICD-10-CM | POA: Diagnosis not present

## 2022-09-11 DIAGNOSIS — Z79899 Other long term (current) drug therapy: Secondary | ICD-10-CM | POA: Diagnosis not present

## 2022-09-11 LAB — LAB REPORT - SCANNED
A1c: 5.8
EGFR: 86
PSA, FREE: 1.02

## 2022-09-12 ENCOUNTER — Encounter: Payer: Self-pay | Admitting: Family Medicine

## 2022-09-30 DIAGNOSIS — L570 Actinic keratosis: Secondary | ICD-10-CM | POA: Diagnosis not present

## 2022-09-30 DIAGNOSIS — L820 Inflamed seborrheic keratosis: Secondary | ICD-10-CM | POA: Diagnosis not present

## 2022-11-13 IMAGING — CT CT CARDIAC CORONARY ARTERY CALCIUM SCORE
3 series · 14 of 20 positions shown, 16 images · non-contrast
Comparison: None.

CLINICAL DATA: 69-year-old Caucasian male with history
hyperlipidemia and family history of heart disease.

EXAM:
CT CARDIAC CORONARY ARTERY CALCIUM SCORE
TECHNIQUE: Non-contrast imaging through the heart was performed using
prospective ECG gating. Image post processing was performed on an
independent workstation, allowing for quantitative analysis of the
heart and coronary arteries. Note that this exam targets the heart
and the chest was not imaged in its entirety.

[Series 2: calcium scoring 2.00 qr36 bestdiast 69% hrt calciu · axial · 0.32mm/px · z∈[+1663,+1749]mm · 4 of 73 slices shown]
[im 15/73  vessel]
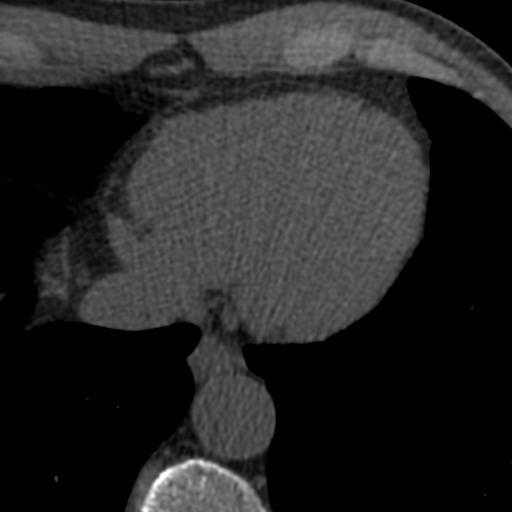
[im 29/73  vessel]
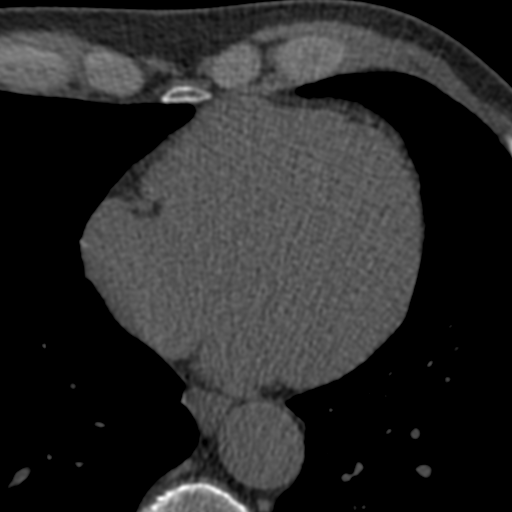
[im 44/73  vessel]
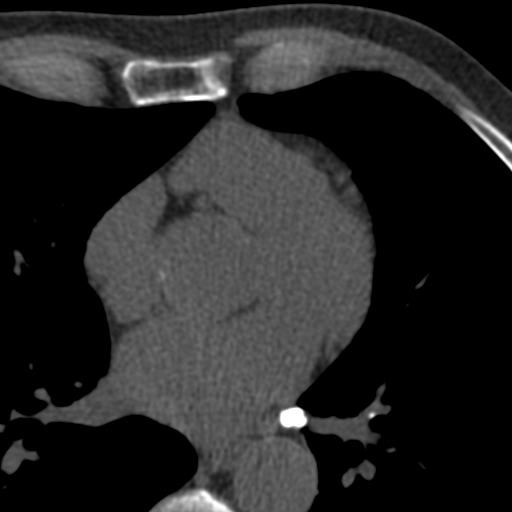
[im 58/73  vessel]
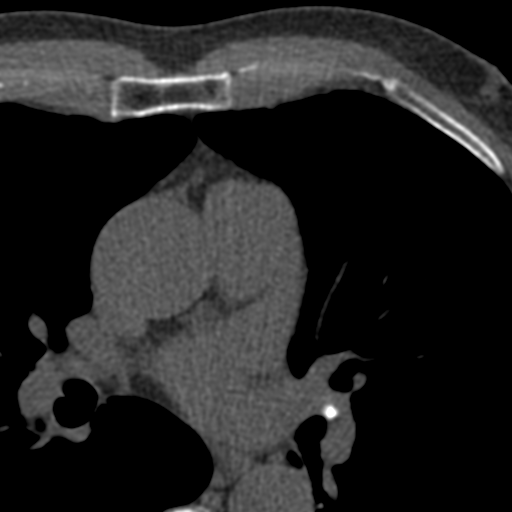

[Series 12: calcium scoring 2.00 br40 bestdiast 69% axial · axial · 0.67mm/px · z∈[+1659,+1755]mm · 5 of 73 slices shown, 7 images]
[im 13/73  vessel]
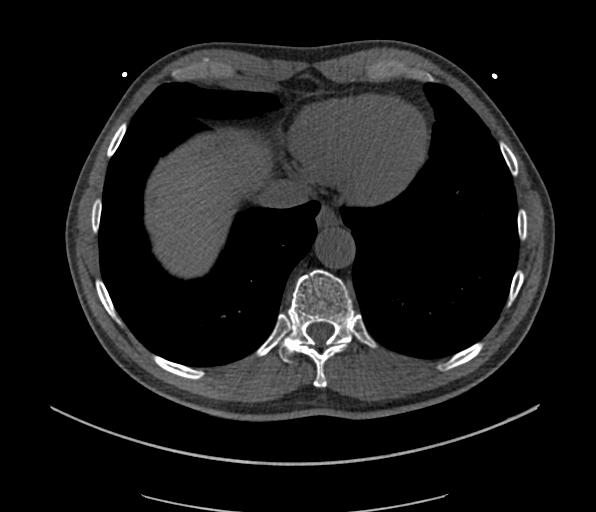
[im 13/73  lung]
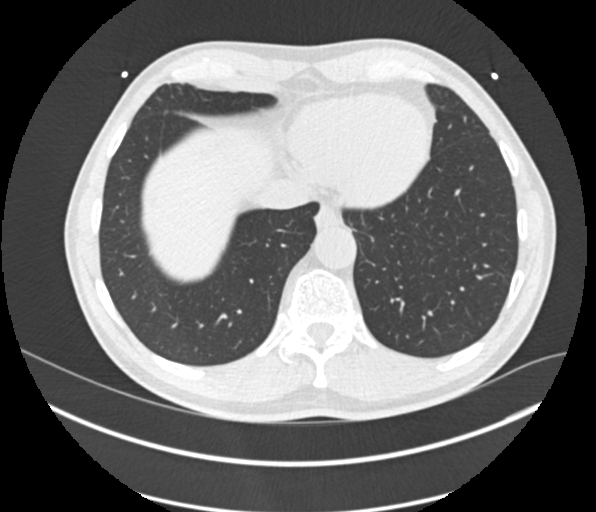
[im 25/73  vessel]
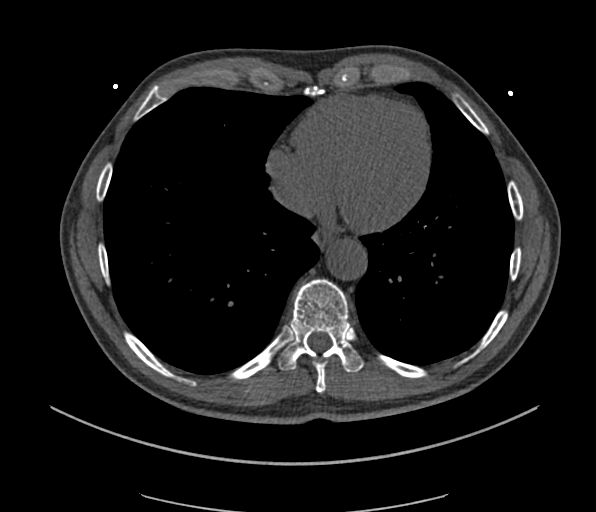
[im 37/73  vessel]
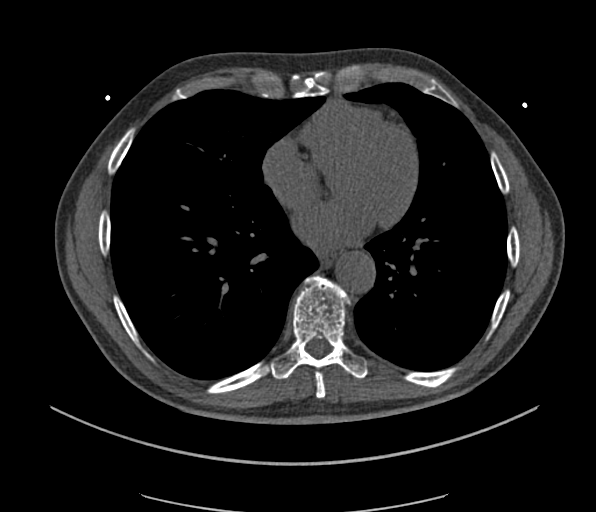
[im 49/73  vessel]
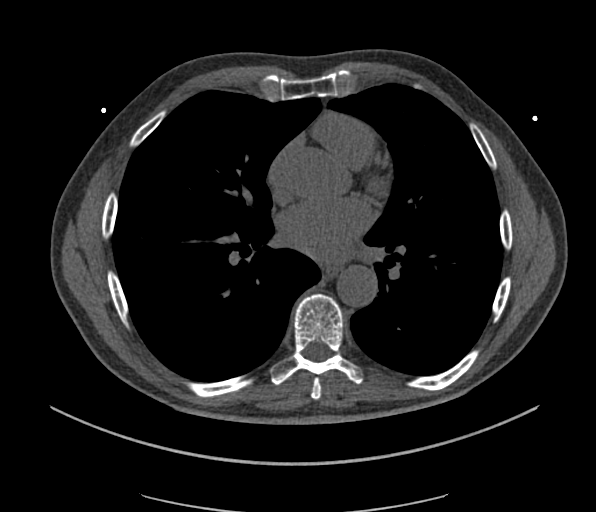
[im 61/73  vessel]
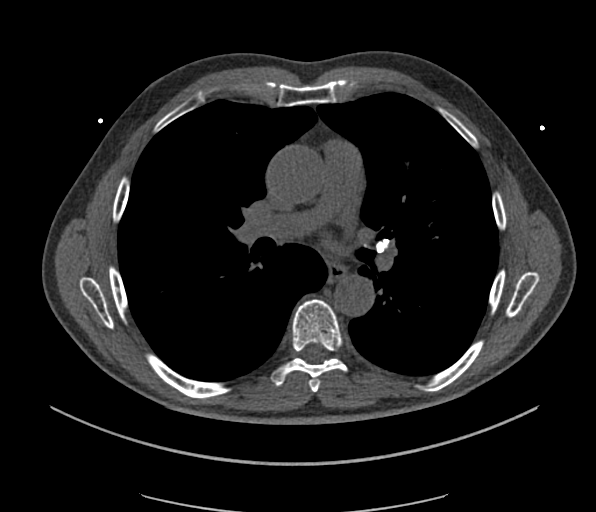
[im 61/73  lung]
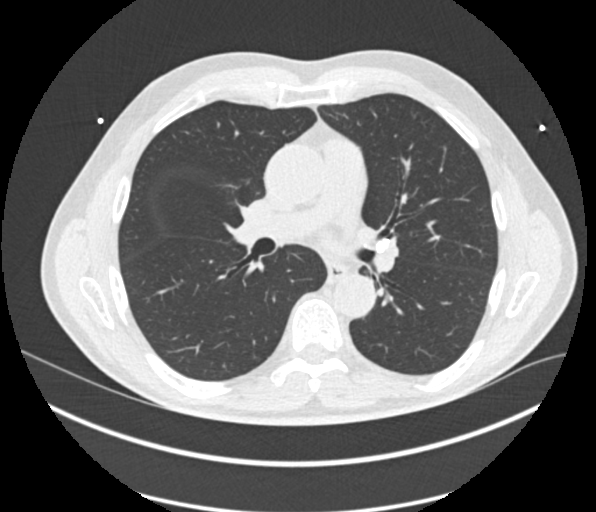

[Series 18: calcium scoring 2.00 br60 bestdiast 69% lungs · axial · 0.67mm/px · z∈[+1659,+1755]mm · 5 of 73 slices shown]
[im 13/73  vessel]
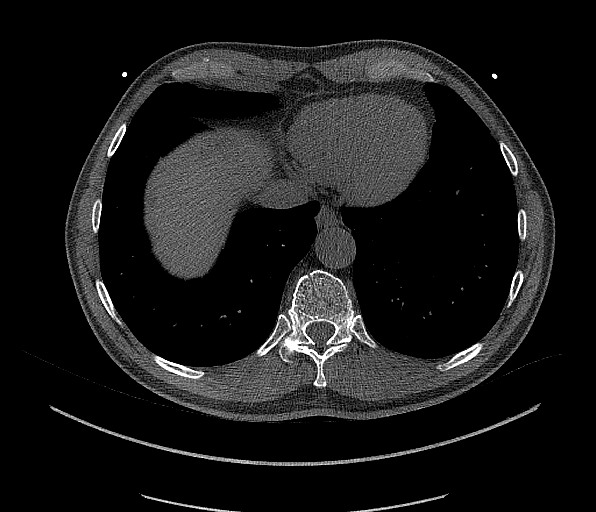
[im 25/73  vessel]
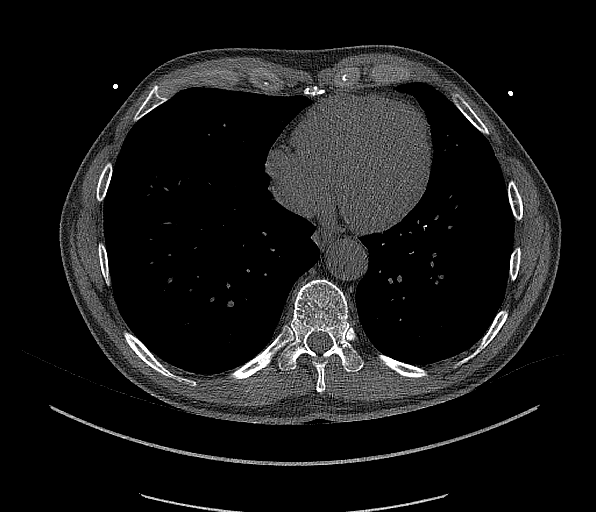
[im 37/73  vessel]
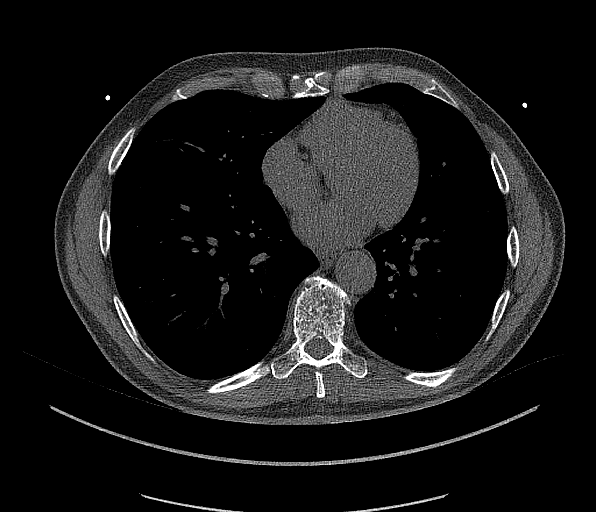
[im 49/73  vessel]
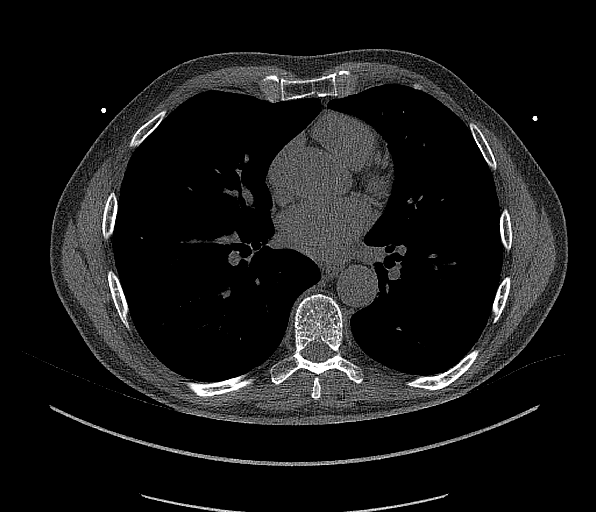
[im 61/73  vessel]
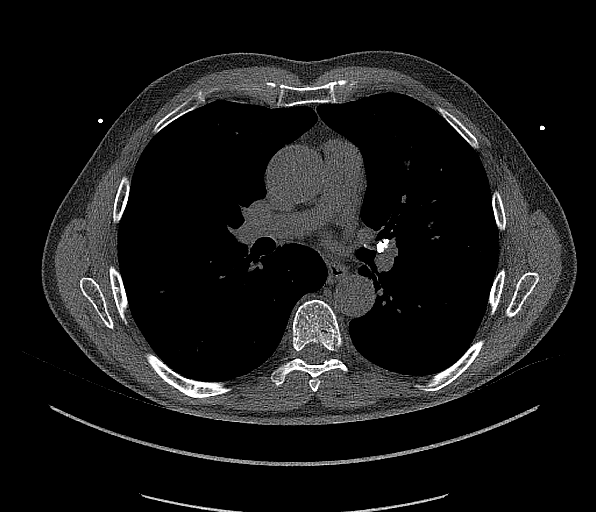

[14 of 20 positions shown; findings below may reference images not displayed]

FINDINGS: CORONARY CALCIUM SCORES:

Left Main: 0

LAD: 105

LCx: 0

RCA: 0

Total Agatston Score: 105

[HOSPITAL] percentile: 48

AORTA MEASUREMENTS:

Ascending Aorta: 38 mm

Descending Aorta: 26 mm

OTHER FINDINGS:

The heart size is within normal limits. No pericardial fluid
identified. Visualized segments of the thoracic aorta and central
pulmonary arteries are normal in caliber. The visualized mediastinum
and hilar regions demonstrate no lymphadenopathy or masses.
Calcified left hilar lymph nodes are consistent with prior
granulomatous disease. Visualized lungs show no evidence of
pulmonary edema, consolidation, pneumothorax, nodule or pleural
fluid. Visualized upper abdomen and bony structures are
unremarkable.
IMPRESSION: Coronary calcium score of 105 is at the 48th percentile for the
patient's age, sex and race.

## 2023-01-06 DIAGNOSIS — L578 Other skin changes due to chronic exposure to nonionizing radiation: Secondary | ICD-10-CM | POA: Diagnosis not present

## 2023-01-06 DIAGNOSIS — L570 Actinic keratosis: Secondary | ICD-10-CM | POA: Diagnosis not present

## 2023-01-06 DIAGNOSIS — Z85828 Personal history of other malignant neoplasm of skin: Secondary | ICD-10-CM | POA: Diagnosis not present

## 2023-01-06 DIAGNOSIS — L814 Other melanin hyperpigmentation: Secondary | ICD-10-CM | POA: Diagnosis not present

## 2023-01-06 DIAGNOSIS — L821 Other seborrheic keratosis: Secondary | ICD-10-CM | POA: Diagnosis not present

## 2023-01-06 DIAGNOSIS — D225 Melanocytic nevi of trunk: Secondary | ICD-10-CM | POA: Diagnosis not present

## 2023-02-08 ENCOUNTER — Other Ambulatory Visit: Payer: Self-pay | Admitting: Internal Medicine

## 2023-03-13 ENCOUNTER — Other Ambulatory Visit: Payer: Self-pay | Admitting: *Deleted

## 2023-03-13 DIAGNOSIS — E785 Hyperlipidemia, unspecified: Secondary | ICD-10-CM

## 2023-09-03 DIAGNOSIS — L814 Other melanin hyperpigmentation: Secondary | ICD-10-CM | POA: Diagnosis not present

## 2023-09-03 DIAGNOSIS — L570 Actinic keratosis: Secondary | ICD-10-CM | POA: Diagnosis not present

## 2023-09-03 DIAGNOSIS — Z85828 Personal history of other malignant neoplasm of skin: Secondary | ICD-10-CM | POA: Diagnosis not present

## 2023-09-03 DIAGNOSIS — Z08 Encounter for follow-up examination after completed treatment for malignant neoplasm: Secondary | ICD-10-CM | POA: Diagnosis not present

## 2023-09-03 DIAGNOSIS — D225 Melanocytic nevi of trunk: Secondary | ICD-10-CM | POA: Diagnosis not present

## 2023-09-03 DIAGNOSIS — L821 Other seborrheic keratosis: Secondary | ICD-10-CM | POA: Diagnosis not present

## 2023-09-10 ENCOUNTER — Other Ambulatory Visit: Payer: Self-pay | Admitting: Internal Medicine

## 2023-09-19 DIAGNOSIS — E7800 Pure hypercholesterolemia, unspecified: Secondary | ICD-10-CM | POA: Diagnosis not present

## 2023-09-19 DIAGNOSIS — Z79899 Other long term (current) drug therapy: Secondary | ICD-10-CM | POA: Diagnosis not present

## 2023-09-19 DIAGNOSIS — Z0000 Encounter for general adult medical examination without abnormal findings: Secondary | ICD-10-CM | POA: Diagnosis not present

## 2023-09-19 DIAGNOSIS — R413 Other amnesia: Secondary | ICD-10-CM | POA: Diagnosis not present

## 2024-01-20 ENCOUNTER — Ambulatory Visit: Admitting: Neurology

## 2024-01-21 ENCOUNTER — Ambulatory Visit: Admitting: Neurology

## 2024-01-21 ENCOUNTER — Telehealth: Payer: Self-pay | Admitting: Neurology

## 2024-01-21 ENCOUNTER — Encounter: Payer: Self-pay | Admitting: Neurology

## 2024-01-21 NOTE — Telephone Encounter (Signed)
 NS for new consult.

## 2024-02-04 ENCOUNTER — Ambulatory Visit: Admitting: Neurology

## 2024-04-07 ENCOUNTER — Encounter: Payer: Self-pay | Admitting: Neurology

## 2024-04-07 ENCOUNTER — Ambulatory Visit: Admitting: Neurology

## 2024-04-07 VITALS — BP 116/75 | HR 67 | Ht 68.0 in | Wt 148.0 lb

## 2024-04-07 DIAGNOSIS — Z820 Family history of epilepsy and other diseases of the nervous system: Secondary | ICD-10-CM

## 2024-04-07 DIAGNOSIS — R413 Other amnesia: Secondary | ICD-10-CM | POA: Diagnosis not present

## 2024-04-07 DIAGNOSIS — Z9189 Other specified personal risk factors, not elsewhere classified: Secondary | ICD-10-CM | POA: Diagnosis not present

## 2024-04-07 DIAGNOSIS — G479 Sleep disorder, unspecified: Secondary | ICD-10-CM | POA: Diagnosis not present

## 2024-04-07 NOTE — Patient Instructions (Signed)
 You had recent blood work through your primary care. We will do a brain scan, called MRI and call you with the test results. We will have to schedule you for this on a separate date. This test requires authorization from your insurance, and we will take care of the insurance process. I recommend we proceed with a sleep study but you declined this test today.  If you change your mind, please let us  know.  You can call our office or send us  a MyChart message. We will request do a formal neuropsychological test (aka cognitive testing) for your memory complaints. This requires a referral to a trained and licensed neuropsychologist and will be a separate appointment at a different clinic.  For now, we will keep you posted as to your MRI results and cognitive evaluation report by phone call and consider follow-up accordingly. Please increase your water intake to about 64 to 80 ounces per day, avoid drinking energy drinks.  Please try to get about 7-1/2 to 8-1/2 hours of sleep per night and try to keep a set schedule for your sleep time and rise time.

## 2024-04-07 NOTE — Progress Notes (Signed)
 Subjective:    Patient ID: Gregory Blackburn is a 74 y.o. male.  HPI    True Mar, MD, PhD [facility] [address] [address] [place], [place] [address]  Dear Dr. Chrystal,  I saw your patient, Gregory Blackburn, upon your kind request in my neurologic clinic today for initial consultation of his memory change.  The patient is unaccompanied today.  As you know, [name] is a 74 year old male with an underlying medical history of hyperlipidemia, and BPH, who reports an approximately 6-8 mo history of short-term memory issues, needing to write things down so he does not forget.  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  He does seem to function better when he has a more set sleep schedule and enough sleep.  He does not always hydrate well by self reflection, estimates that he drinks about 40 ounces or less of water per day.  He drinks alcohol rarely.  He is a non-smoker.  He denies any recurrent headaches.  He has been told that he snores.  He does not wish to have a sleep study and will not consider CPAP therapy he states.  He has no set sleep and rides schedule.  He lives at home with his wife.  He drinks caffeine occasionally in the form of coffee but admits to drinking 1 energy drink per day on average.  I reviewed your office note from 09/19/2023. He reported issues with forgetfulness at the time.  He reported a family history of memory loss affecting his sister, his mother and grandfather.  He had blood work at the time including A1c, thyroid  function test, CBC, CMP, vitamin D level and vitamin B12.  I reviewed test results in his paper chart: TSH was normal at 2.06, lipid panel showed total cholesterol of 189, triglycerides 63, LDL borderline at 119, vitamin D 107.4, mildly elevated above  range, CMP showed glucose of 101, BUN 26, creatinine 0.88, sodium 142, potassium 4.9, ALT 30, AST 38, alk phos 43, CBC without differential benign, vitamin B12 elevated at 1537.  He has not had any brain imaging.  He was advised to reduce his over-the-counter vitamin D and vitamin B12 supplements. He has not had a brain imaging test.    He reports that his maternal grandfather and his mom had dementia, mom lived to be 52 and died from lung cancer.  His Past Medical History Is Significant For: Past Medical History:  Diagnosis Date   Arthritis    right shoulder   Heart murmur    as a kid, gone now    His Past Surgical History Is Significant For: Past Surgical History:  Procedure Laterality Date   ANKLE SURGERY     left   APPENDECTOMY  2004   COLONOSCOPY     NASAL SEPTUM SURGERY     SHOULDER HEMI-ARTHROPLASTY Right 09/08/2015   Procedure: RIGHT SHOULDER HEMI-ARTHROPLASTY ;  Surgeon: Marcey Her, MD;  Location: [facility];  Service: Orthopedics;  Laterality: Right;    His Family History Is Significant For: Family History  Problem Relation Age of Onset   Alzheimer's disease Mother    Lung cancer Mother     His Social History Is Significant For: Social History   Socioeconomic History   Marital status: Married    Spouse name: Not on  file   Number of children: Not on file   Years of education: Not on file   Highest education level: Not on file  Occupational History   Not on file  Tobacco Use   Smoking status: Never   Smokeless tobacco: Never  Vaping Use   Vaping status: Not on file  Substance and Sexual Activity   Alcohol use: Yes    Comment: occasional   Drug use: No   Sexual activity: Not on file  Other Topics Concern   Not on file  Social History Narrative   Pt lives with family    Pt works    Social Drivers of Health   Tobacco Use: Low Risk (04/07/2024)   Patient History    Smoking Tobacco Use: Never    Smokeless Tobacco Use: Never    Passive Exposure: Not on  file  Financial Resource Strain: Not on file  Food Insecurity: Not on file  Transportation Needs: Not on file  Physical Activity: Not on file  Stress: Not on file  Social Connections: Not on file  Depression (EYV7-0): Not on file  Alcohol Screen: Not on file  Housing: Not on file  Utilities: Not on file  Health Literacy: Not on file    His Allergies Are:  Allergies[1]:   His Current Medications Are:  Outpatient Encounter Medications as of 04/07/2024  Medication Sig   Acetylcysteine (NAC PO) Take by mouth.   Ascorbic Acid (VITAMIN C) 100 MG tablet Take 100 mg by mouth daily.   cholecalciferol (VITAMIN D3) 25 MCG (1000 UNIT) tablet Take 1,000 Units by mouth daily.   glucosamine-chondroitin 500-400 MG tablet Take 1 tablet by mouth 3 (three) times daily.   Misc Natural Products (SUPER GREENS) POWD Take 1 scoop by mouth daily. (Patient taking differently: Take 1 scoop by mouth daily. Green vibrance)   Omega-3 Fatty Acids (OMEGA III EPA+DHA) 1000 MG CAPS Take by mouth.   Protein POWD Take by mouth.   rosuvastatin  (CRESTOR ) 20 MG tablet TAKE 1 TABLET BY MOUTH EVERY DAY   UNABLE TO FIND Med Name: SLCP CIRCUMIN   vitamin E 180 MG (400 UNITS) capsule Take 400 Units by mouth daily.   Homeopathic Products (ZICAM ALLERGY RELIEF NA) Place 1 spray into the nose 2 (two) times daily as needed (ALLERGIES AND CONGESTION).   Turmeric 500 MG CAPS Take by mouth.   No facility-administered encounter medications on file as of 04/07/2024.  :   Review of Systems:  Out of a complete 14 point review of systems, all are reviewed and negative with the exception of these symptoms as listed below:  Review of Systems  Objective:  Neurological Exam  Physical Exam Physical Examination:   Vitals:   04/07/24 1023  BP: 116/75  Pulse: 67    General Examination: The patient is a 74 year old male in no acute distress.    We did not have enough time to do a MoCA or MMSE today.  He does need quite a bit of  redirection at times.  HEENT: Normocephalic, atraumatic, pupils are equal, round and reactive to light, extraocular tracking is good without limitation to gaze excursion or nystagmus noted. No photophobia. No Corrective eye glasses in place. Hearing is grossly intact.  Face is symmetric with normal facial animation. Speech is clear without dysarthria. There is no hypophonia. There is no lip, neck/head, jaw or voice tremor. Neck is supple with full range of passive and active motion. There are no carotid bruits on auscultation.  Airway/Oropharynx  exam reveals: mild mouth dryness, adequate dental hygiene and mild airway crowding, due to small airway entry. Tongue protrudes centrally and palate elevates symmetrically.    Chest: Clear to auscultation without wheezing, rhonchi or crackles noted.  Heart: S1+S2+0, regular and normal without murmurs, rubs or gallops noted.   Abdomen: Soft, non-tender and non-distended.  Extremities: There is no pitting edema in the distal lower extremities bilaterally.   Skin: Warm and dry without trophic changes noted.   Musculoskeletal: exam reveals no obvious joint deformities.   Neurologically:  Mental status: The patient is awake, alert and oriented in all 4 spheres. His immediate and remote memory, attention, language skills and fund of knowledge are appropriate.  However, he does need fairly frequent redirection.  There is no evidence of aphasia, agnosia, apraxia or anomia. Speech is clear with normal prosody and enunciation.  Occasional pressured speech noted.   Cranial nerves II - XII are as described above under HEENT exam.  Motor exam: Normal bulk, strength and tone is noted. There is no obvious action or resting tremor.  Fine motor skills and coordination: Intact fine motor skills in the upper and lower extremities bilaterally. Reflexes 1+ throughout, toes are downgoing bilaterally. Romberg negative. Cerebellar testing: No dysmetria or intention tremor.  There is no truncal or gait ataxia.  Sensory exam: intact to light touch in the upper and lower extremities.  Gait, station and balance: He stands easily. No veering to one side is noted. No leaning to one side is noted. Posture is age-appropriate and stance is narrow based. Gait shows normal stride length and normal pace. No problems turning are noted.  Normal tandem walk for age.  Assessment and Plan:  In summary, Ryland Tungate is a very pleasant 74 y.o.-year old male with an underlying medical history of hyperlipidemia, and BPH, who presents for evaluation of his short term memory difficulty over the past 6 to 8 months.  While we did not have enough time during this visit for an MMSE or MoCA, I would like to proceed with more in-depth memory testing with the help of a neuropsychologist.  Neurological exam is nonfocal today.  I had a long discussion with the patient today and I discussed the importance of lifestyle modification, keeping a schedule for his sleep, staying better hydrated with water, avoiding energy drinks and alcohol.  He had recent blood work through your office.  I do not believe we need to add any blood work today.  We talked about workup for memory loss including imaging testing and evaluation for an underlying sleep disorder, particularly obstructive sleep apnea.  He declines a sleep study and reports that he would not consider CPAP therapy.   Below is a summary of my recommendations and our discussion points from today's visit, based on chart review, history and examination. << You had recent blood work through your primary care. We will do a brain scan, called MRI and call you with the test results. We will have to schedule you for this on a separate date. This test requires authorization from your insurance, and we will take care of the insurance process. I recommend we proceed with a sleep study but you declined this test today.  If you change your mind, please let us  know.  You  can call our office or send us  a MyChart message. We will request do a formal neuropsychological test (aka cognitive testing) for your memory complaints. This requires a referral to a trained and licensed neuropsychologist and will  be a separate appointment at a different clinic.  For now, we will keep you posted as to your MRI results and cognitive evaluation report by phone call and consider follow-up accordingly. Please increase your water intake to about 64 to 80 ounces per day, avoid drinking energy drinks.  Please try to get about 7-1/2 to 8-1/2 hours of sleep per night and try to keep a set schedule for your sleep time and rise time.>>   This was an extended visit of over 60 minutes with copious record review involved in considerable counseling and coordination of care.  Thank you very much for allowing me to participate in the care of this nice patient. If I can be of any further assistance to you please do not hesitate to call me at [phone].  Sincerely,   True Mar, MD, PhD     [1]  Allergies Allergen Reactions   Ciprofloxacin Other (See Comments)    PT WORKS IN THE SUN, AND MEDICATION GIVES SUN SENSITIVITY

## 2024-04-08 ENCOUNTER — Telehealth: Payer: Self-pay | Admitting: Neurology

## 2024-04-08 NOTE — Telephone Encounter (Signed)
 no auth required sent to [facility] for an open MRI. [phone]

## 2024-04-20 ENCOUNTER — Encounter: Payer: Self-pay | Admitting: Psychology

## 2024-05-05 ENCOUNTER — Telehealth: Payer: Self-pay | Admitting: *Deleted

## 2024-05-05 NOTE — Telephone Encounter (Signed)
 I called pt and LMVM for him to return call.  We received a fax from [facility] about pt not being able to do the MRI due to claustrophobia.  He has not rescheduled wanted to speak to us  first.

## 2024-05-05 NOTE — Telephone Encounter (Signed)
 Please call and inquire if pt would like to try the MRI with taking anxiety medication such as alprazolam.  I will be happy to call in Xanax to his pharmacy.  If agreeable, please enter Rx for Xanax/alprazolam 0.5 mg strength take 1-2 pills as needed on call to MRI. May take 1 3rd pill if needed. #3 with 0 ref.

## 2024-05-06 NOTE — Telephone Encounter (Signed)
 LMVM for pt to return call.

## 2024-07-21 ENCOUNTER — Encounter: Admitting: Psychology
